# Patient Record
Sex: Female | Born: 1935 | Race: White | Hispanic: No | Marital: Married | Smoking: Never smoker
Health system: Southern US, Community
[De-identification: ages and names within clinical notes are randomized; demographics above are authoritative.]

## PROBLEM LIST (undated history)

## (undated) DIAGNOSIS — K589 Irritable bowel syndrome without diarrhea: Secondary | ICD-10-CM

## (undated) DIAGNOSIS — Z9889 Other specified postprocedural states: Secondary | ICD-10-CM

## (undated) DIAGNOSIS — I4891 Unspecified atrial fibrillation: Secondary | ICD-10-CM

## (undated) DIAGNOSIS — I10 Essential (primary) hypertension: Secondary | ICD-10-CM

## (undated) DIAGNOSIS — R112 Nausea with vomiting, unspecified: Secondary | ICD-10-CM

## (undated) HISTORY — PX: HEMORRHOID SURGERY: SHX153

## (undated) HISTORY — PX: TONSILLECTOMY: SUR1361

## (undated) HISTORY — DX: Irritable bowel syndrome, unspecified: K58.9

---

## 1997-08-26 ENCOUNTER — Other Ambulatory Visit: Admission: RE | Admit: 1997-08-26 | Discharge: 1997-08-26 | Payer: Self-pay | Admitting: Obstetrics & Gynecology

## 1998-02-17 ENCOUNTER — Other Ambulatory Visit: Admission: RE | Admit: 1998-02-17 | Discharge: 1998-02-17 | Payer: Self-pay | Admitting: Obstetrics & Gynecology

## 1998-08-04 ENCOUNTER — Other Ambulatory Visit: Admission: RE | Admit: 1998-08-04 | Discharge: 1998-08-04 | Payer: Self-pay | Admitting: Obstetrics & Gynecology

## 1999-02-02 ENCOUNTER — Other Ambulatory Visit: Admission: RE | Admit: 1999-02-02 | Discharge: 1999-02-02 | Payer: Self-pay | Admitting: Obstetrics & Gynecology

## 1999-08-10 ENCOUNTER — Other Ambulatory Visit: Admission: RE | Admit: 1999-08-10 | Discharge: 1999-08-10 | Payer: Self-pay | Admitting: Obstetrics & Gynecology

## 2000-02-07 ENCOUNTER — Other Ambulatory Visit: Admission: RE | Admit: 2000-02-07 | Discharge: 2000-02-07 | Payer: Self-pay | Admitting: Obstetrics & Gynecology

## 2000-08-08 ENCOUNTER — Other Ambulatory Visit: Admission: RE | Admit: 2000-08-08 | Discharge: 2000-08-08 | Payer: Self-pay | Admitting: Obstetrics & Gynecology

## 2001-01-23 ENCOUNTER — Other Ambulatory Visit: Admission: RE | Admit: 2001-01-23 | Discharge: 2001-01-23 | Payer: Self-pay | Admitting: Obstetrics & Gynecology

## 2002-01-31 ENCOUNTER — Other Ambulatory Visit: Admission: RE | Admit: 2002-01-31 | Discharge: 2002-01-31 | Payer: Self-pay | Admitting: Obstetrics & Gynecology

## 2004-02-03 ENCOUNTER — Other Ambulatory Visit: Admission: RE | Admit: 2004-02-03 | Discharge: 2004-02-03 | Payer: Self-pay | Admitting: Obstetrics & Gynecology

## 2005-01-11 ENCOUNTER — Other Ambulatory Visit: Admission: RE | Admit: 2005-01-11 | Discharge: 2005-01-11 | Payer: Self-pay | Admitting: Radiology

## 2005-08-02 ENCOUNTER — Ambulatory Visit (HOSPITAL_COMMUNITY): Admission: RE | Admit: 2005-08-02 | Discharge: 2005-08-02 | Payer: Self-pay | Admitting: Internal Medicine

## 2005-08-02 ENCOUNTER — Ambulatory Visit: Payer: Self-pay | Admitting: Internal Medicine

## 2010-09-21 ENCOUNTER — Encounter (HOSPITAL_BASED_OUTPATIENT_CLINIC_OR_DEPARTMENT_OTHER): Payer: Medicare Other | Admitting: Internal Medicine

## 2010-09-21 ENCOUNTER — Ambulatory Visit (HOSPITAL_COMMUNITY)
Admission: RE | Admit: 2010-09-21 | Discharge: 2010-09-21 | Disposition: A | Discharge status: Home / Self Care | Payer: Medicare Other | Source: Ambulatory Visit | Attending: Internal Medicine | Admitting: Internal Medicine

## 2010-09-21 DIAGNOSIS — K5730 Diverticulosis of large intestine without perforation or abscess without bleeding: Secondary | ICD-10-CM | POA: Insufficient documentation

## 2010-09-21 DIAGNOSIS — Z860100 Personal history of colon polyps, unspecified: Secondary | ICD-10-CM | POA: Insufficient documentation

## 2010-09-21 DIAGNOSIS — Z800 Family history of malignant neoplasm of digestive organs: Secondary | ICD-10-CM

## 2010-09-21 DIAGNOSIS — K644 Residual hemorrhoidal skin tags: Secondary | ICD-10-CM | POA: Insufficient documentation

## 2010-10-10 NOTE — Op Note (Signed)
  [name], [name]              ACCOUNT NO.:  [id number]  MEDICAL RECORD NO.:  [id number]           PATIENT TYPE:  O  LOCATION:  DAYP                          FACILITY:  [facility]  PHYSICIAN:  Lionel December, M.D.    DATE OF BIRTH:  1933-04-16  DATE OF PROCEDURE:  09/21/2010 DATE OF DISCHARGE:                              OPERATIVE REPORT   PROCEDURE:  Colonoscopy.  INDICATION:  The patient is a 77 year old Caucasian female who had cecal adenoma removed about 5 years ago.  Family history is also positive for colon carcinoma in 2 siblings, brother and a sister, both had their condition diagnosis in their 41s.  Procedure risks were reviewed with the patient.  Informed consent was obtained.  MEDICATIONS FOR CONSCIOUS SEDATION:  Demerol 50 mg IV, Versed 3 mg IV.  FINDINGS:  Procedure performed in endoscopy suite.  The patient's vital signs and O2 saturations were monitored during the procedure and remained stable.  The patient was placed in left lateral recumbent position.  Rectal examination was performed.  No abnormality was noted on external or digital exam.  Pentax videoscope was placed in rectum and advanced under vision into sigmoid colon and beyond.  Preparation was excellent.  Very redundant tortuous sigmoid colon with few scattered diverticula.  Some difficulty encountered in passing the scope from descending to splenic flexure.  Once we were able to do this, I was able to advance the scope into cecum.  Few more diverticula were noted at transverse colon, all of these was small.  Scope was passed into cecum which was identified by appendiceal orifice and ileocecal valve. Pictures were taken for the record.  As the scope was withdrawn, colonic mucosa was carefully examined.  No polyps, tumor masses, or other abnormalities were noted.  Rectal mucosa was normal.  Scope was retroflexed to examine anorectal junction and small hemorrhoids noted below the dentate line along with  single anal papilla.  Endoscope was withdrawn.  Withdrawal time was 6 minutes.  The patient tolerated the procedure well.  FINAL DIAGNOSES: 1. Examination performed to cecum. 2. Redundant colon with few small diverticula at sigmoid and     transverse colon. 3. No evidence of recurrent polyps. 4. External hemorrhoids and single anal papilla.  RECOMMENDATIONS: 1. She will continue high-fiber diet and Align 1 capsule daily. 2. She should consider next exam in 5 years from now.          ______________________________ Lionel December, M.D.     NR/MEDQ  D:  09/21/2010  T:  09/22/2010  Job:  [id number]  cc:   Dr. Reuel Boom  Electronically Signed by Lionel December M.D. on 10/10/2010 04:54:09 PM

## 2011-03-07 ENCOUNTER — Other Ambulatory Visit (INDEPENDENT_AMBULATORY_CARE_PROVIDER_SITE_OTHER): Payer: Self-pay | Admitting: Internal Medicine

## 2012-05-17 ENCOUNTER — Other Ambulatory Visit (INDEPENDENT_AMBULATORY_CARE_PROVIDER_SITE_OTHER): Payer: Self-pay | Admitting: Internal Medicine

## 2012-10-23 ENCOUNTER — Other Ambulatory Visit (INDEPENDENT_AMBULATORY_CARE_PROVIDER_SITE_OTHER): Payer: Self-pay | Admitting: Internal Medicine

## 2013-04-18 ENCOUNTER — Other Ambulatory Visit (INDEPENDENT_AMBULATORY_CARE_PROVIDER_SITE_OTHER): Payer: Self-pay | Admitting: Internal Medicine

## 2014-04-13 ENCOUNTER — Other Ambulatory Visit: Payer: Self-pay | Admitting: Obstetrics & Gynecology

## 2014-04-14 LAB — CYTOLOGY - PAP

## 2014-07-09 ENCOUNTER — Other Ambulatory Visit (INDEPENDENT_AMBULATORY_CARE_PROVIDER_SITE_OTHER): Payer: Self-pay | Admitting: *Deleted

## 2014-07-09 ENCOUNTER — Encounter (INDEPENDENT_AMBULATORY_CARE_PROVIDER_SITE_OTHER): Payer: Self-pay | Admitting: Internal Medicine

## 2014-07-09 ENCOUNTER — Telehealth (INDEPENDENT_AMBULATORY_CARE_PROVIDER_SITE_OTHER): Payer: Self-pay | Admitting: *Deleted

## 2014-07-09 ENCOUNTER — Ambulatory Visit (INDEPENDENT_AMBULATORY_CARE_PROVIDER_SITE_OTHER): Payer: Medicare Other | Admitting: Internal Medicine

## 2014-07-09 VITALS — BP 140/46 | HR 64 | Temp 97.7°F | Ht 65.0 in | Wt 126.0 lb

## 2014-07-09 DIAGNOSIS — Z86010 Personal history of colonic polyps: Secondary | ICD-10-CM

## 2014-07-09 DIAGNOSIS — K589 Irritable bowel syndrome without diarrhea: Secondary | ICD-10-CM

## 2014-07-09 DIAGNOSIS — Z800 Family history of malignant neoplasm of digestive organs: Secondary | ICD-10-CM

## 2014-07-09 DIAGNOSIS — K5909 Other constipation: Secondary | ICD-10-CM

## 2014-07-09 DIAGNOSIS — Z1211 Encounter for screening for malignant neoplasm of colon: Secondary | ICD-10-CM

## 2014-07-09 NOTE — Patient Instructions (Signed)
Colonoscopy.  The risks and benefits such as perforation, bleeding, and infection were reviewed with the patient and is agreeable. 

## 2014-07-09 NOTE — Telephone Encounter (Signed)
Patient needs trilyte 

## 2014-07-09 NOTE — Progress Notes (Signed)
   Subjective:    Patient ID: [name], female    DOB: 05/11/1933, 81 y.o.   MRN: [id number]  HPI Here today with c/o a UTI 06/20/2014. She was seen in the ED for ? Food poisoning. Found to have a UTI and started on Levaquin x 7 days.   She says the medication constipated her and then she had rectal bleeding. Her stool was very hard. She had to strain. She says she saw the blood on the paper. Constipated off and on for a couple of days.  The rectal bleeding x 2-3 days and then resolved. She saw Dr Arcola Jansky this Tuesday and was given Anusol. . She says she is not bleeding now. BMs are back to normal. Family hx of colon cancer. There has been no weight loss. Appetite is good.  No abdominal pain.  No further bleeding.  No NSAIDs   09/22/2010 Colonoscopy:  INDICATION: The patient is a 81 year old Caucasian female who had cecal adenoma removed about 5 years ago. Family history is also positive for colon carcinoma in 2 siblings, brother and a sister, both had their condition diagnosis in their 44s.    FINAL DIAGNOSES: 1. Examination performed to cecum. 2. Redundant colon with few small diverticula at sigmoid and  transverse colon. 3. No evidence of recurrent polyps. 4. External hemorrhoids and single anal papilla. Review of Systems Past Medical History  Diagnosis Date  . IBS (irritable bowel syndrome)     Past Surgical History  Procedure Laterality Date  . Hemorrhoid surgery    . Tonsillectomy      Allergies  Allergen Reactions  . Mercury     Current Outpatient Prescriptions on File Prior to Visit  Medication Sig Dispense Refill  . hyoscyamine (LEVSIN SL) 0.125 MG SL tablet PLACE 1 TABLET UNDER TONGUE THREE TIMES DAILY AS NEEDED 90 tablet 2   No current facility-administered medications on file prior to visit.   Past Medical History  Diagnosis Date  . IBS (irritable bowel syndrome)     Past Surgical History  Procedure Laterality Date  . Hemorrhoid surgery     . Tonsillectomy      Allergies  Allergen Reactions  . Mercury     Current Outpatient Prescriptions on File Prior to Visit  Medication Sig Dispense Refill  . hyoscyamine (LEVSIN SL) 0.125 MG SL tablet PLACE 1 TABLET UNDER TONGUE THREE TIMES DAILY AS NEEDED 90 tablet 2   No current facility-administered medications on file prior to visit.         Objective:   Physical Exam Blood pressure 140/46, pulse 64, temperature 97.7 F (36.5 C), height 5\' 5"  (1.651 m), weight 126 lb (57.153 kg).  Alert and oriented. Skin warm and dry. Oral mucosa is moist.   . Sclera anicteric, conjunctivae is pink. Thyroid not enlarged. No cervical lymphadenopathy. Lungs clear. Heart regular rate and rhythm.  Abdomen is soft. Bowel sounds are positive. No hepatomegaly. No abdominal masses felt. No tenderness.  No edema to lower extremities.  Stool brown and guaiac negative.  Card Lot 2-14 exp. 7/18 Developer [id number]  Exp 09/2013      Assessment & Plan:  Rectal bleeding resolved with Procto cream. She had been constipated.  Family hx of colon cancer. Will schedule a colonoscopy with Dr.Rehman given her hx.

## 2014-07-13 MED ORDER — PEG 3350-KCL-NA BICARB-NACL 420 G PO SOLR: 4000.0000 mL | Freq: Once | ORAL | Status: DC

## 2014-07-24 ENCOUNTER — Encounter (HOSPITAL_COMMUNITY)
Admission: RE | Disposition: A | Discharge status: Home / Self Care | Payer: Self-pay | Source: Ambulatory Visit | Attending: Internal Medicine

## 2014-07-24 ENCOUNTER — Ambulatory Visit (HOSPITAL_COMMUNITY)
Admission: RE | Admit: 2014-07-24 | Discharge: 2014-07-24 | Disposition: A | Discharge status: Home / Self Care | Payer: Medicare Other | Source: Ambulatory Visit | Attending: Internal Medicine | Admitting: Internal Medicine

## 2014-07-24 ENCOUNTER — Encounter (HOSPITAL_COMMUNITY): Payer: Self-pay | Admitting: *Deleted

## 2014-07-24 DIAGNOSIS — K589 Irritable bowel syndrome without diarrhea: Secondary | ICD-10-CM | POA: Insufficient documentation

## 2014-07-24 DIAGNOSIS — Z86010 Personal history of colonic polyps: Secondary | ICD-10-CM | POA: Diagnosis not present

## 2014-07-24 DIAGNOSIS — K625 Hemorrhage of anus and rectum: Secondary | ICD-10-CM | POA: Diagnosis not present

## 2014-07-24 DIAGNOSIS — K644 Residual hemorrhoidal skin tags: Secondary | ICD-10-CM | POA: Diagnosis not present

## 2014-07-24 DIAGNOSIS — K648 Other hemorrhoids: Secondary | ICD-10-CM

## 2014-07-24 DIAGNOSIS — K5909 Other constipation: Secondary | ICD-10-CM

## 2014-07-24 DIAGNOSIS — K5730 Diverticulosis of large intestine without perforation or abscess without bleeding: Secondary | ICD-10-CM | POA: Insufficient documentation

## 2014-07-24 DIAGNOSIS — K624 Stenosis of anus and rectum: Secondary | ICD-10-CM | POA: Diagnosis not present

## 2014-07-24 DIAGNOSIS — Z800 Family history of malignant neoplasm of digestive organs: Secondary | ICD-10-CM | POA: Diagnosis not present

## 2014-07-24 DIAGNOSIS — F419 Anxiety disorder, unspecified: Secondary | ICD-10-CM | POA: Diagnosis not present

## 2014-07-24 DIAGNOSIS — K6289 Other specified diseases of anus and rectum: Secondary | ICD-10-CM

## 2014-07-24 DIAGNOSIS — Z888 Allergy status to other drugs, medicaments and biological substances status: Secondary | ICD-10-CM | POA: Insufficient documentation

## 2014-07-24 HISTORY — PX: COLONOSCOPY: SHX5424

## 2014-07-24 SURGERY — COLONOSCOPY
Anesthesia: Moderate Sedation

## 2014-07-24 MED ORDER — MEPERIDINE HCL 50 MG/ML IJ SOLN
INTRAMUSCULAR | Status: AC
  Filled 2014-07-24: qty 1

## 2014-07-24 MED ORDER — MIDAZOLAM HCL 5 MG/5ML IJ SOLN
INTRAMUSCULAR | Status: AC
  Filled 2014-07-24: qty 10

## 2014-07-24 MED ORDER — SODIUM CHLORIDE 0.9 % IV SOLN
INTRAVENOUS | Status: DC
  Administered 2014-07-24: 09:00:00 via INTRAVENOUS

## 2014-07-24 MED ORDER — MIDAZOLAM HCL 5 MG/5ML IJ SOLN
INTRAMUSCULAR | Status: DC | PRN
  Administered 2014-07-24 (×2): 1 mg via INTRAVENOUS
  Administered 2014-07-24: 2 mg via INTRAVENOUS

## 2014-07-24 MED ORDER — BENEFIBER DRINK MIX PO PACK: 4.0000 g | PACK | Freq: Every day | ORAL | Status: DC

## 2014-07-24 MED ORDER — STERILE WATER FOR IRRIGATION IR SOLN
Status: DC | PRN
  Administered 2014-07-24: 09:00:00

## 2014-07-24 MED ORDER — MEPERIDINE HCL 50 MG/ML IJ SOLN
INTRAMUSCULAR | Status: DC | PRN
  Administered 2014-07-24: 25 mg via INTRAVENOUS

## 2014-07-24 NOTE — Op Note (Signed)
COLONOSCOPY PROCEDURE REPORT  PATIENT:  [name]  MR#:  [id number] Birthdate:  09-18-1932, 81 y.o., female Endoscopist:  Dr. Rogene Houston, MD Referred By:  Dr. Gar Ponto, MD Procedure Date: 07/24/2014  Procedure:   Colonoscopy  Indications:  Patient is 81 year old Caucasian female who has history of colonic polyps family history of colon carcinoma whose last colonoscopy was in May 2012 who presents with history of rectal bleeding lasting for a few days and she also gives history of constipation. She is undergoing diagnostic colonoscopy. While no polyps were found on her last colonoscopy she had cecal adenoma removed about 9 years ago.  Informed Consent:  The procedure and risks were reviewed with the patient and informed consent was obtained.  Medications:  Demerol 25 mg IV Versed 4 mg IV  Description of procedure:  After a digital rectal exam was performed, that colonoscope was advanced from the anus through the rectum and colon to the area of the cecum, ileocecal valve and appendiceal orifice. The cecum was deeply intubated. These structures were well-seen and photographed for the record. From the level of the cecum and ileocecal valve, the scope was slowly and cautiously withdrawn. The mucosal surfaces were carefully surveyed utilizing scope tip to flexion to facilitate fold flattening as needed. The scope was pulled down into the rectum where a thorough exam including retroflexion was performed.  Findings:   Prep excellent. Redundant colon with few small diverticula scattered throughout. Normal rectal mucosa. Small hemorrhoids below the dentate line along with anal papillae. Noncritical anal stenosis.    Therapeutic/Diagnostic Maneuvers Performed:  None  Complications:  None  Cecal Withdrawal Time:  7  minutes  Impression:  Examination performed to cecum. Few scattered diverticula throughout the colon. Noncritical anal stenosis along with external hemorrhoids and  anal papillae.  Recommendations:  Standard instructions given. High fiber diet. Patient will call if she develops constipation again.  REHMAN,NAJEEB U  07/24/2014 10:01 AM  CC: Dr. Gar Ponto, MD & Dr. Rayne Du ref. provider found

## 2014-07-24 NOTE — H&P (Signed)
[name] is an 81 y.o. female.   Chief Complaint: Patient is here for colonoscopy. HPI: Patient is 81 year old Caucasian female who presents with recent history of rectal bleeding. She believes she developed fissure on becoming constipated which was from the medication she took for UTI. Bleeding has stopped. She has history of colonic adenomas. Last colonoscopy was in May 2012. Family history significant for venous malignancies including colon carcinoma in a sister who was in her 68s and she developed 2 other malignancies and died in her 29s. Her brother had surgery for CRC in 5s and is doing fine at age 61.  Past Medical History  Diagnosis Date  . IBS (irritable bowel syndrome)     Past Surgical History  Procedure Laterality Date  . Hemorrhoid surgery    . Tonsillectomy      History reviewed. No pertinent family history. Social History:  reports that she has never smoked. She does not have any smokeless tobacco history on file. She reports that she does not drink alcohol or use illicit drugs.  Allergies:  Allergies  Allergen Reactions  . Mercury Other (See Comments)    unknown    Medications Prior to Admission  Medication Sig Dispense Refill  . ALPRAZolam (XANAX) 0.25 MG tablet Take 0.25 mg by mouth at bedtime as needed for anxiety.    . calcium carbonate (OS-CAL) 600 MG TABS tablet Take 600 mg by mouth 2 (two) times daily with a meal.    . Calcium Carbonate-Vitamin D (CALTRATE COLON HEALTH PO) Take 1 tablet by mouth daily.     . cholecalciferol (VITAMIN D) 400 UNITS TABS tablet Take 1,000 Units by mouth.    . citalopram (CELEXA) 20 MG tablet Take 20 mg by mouth daily.    . hydrocortisone (ANUSOL-HC) 2.5 % rectal cream Place 1 application rectally 2 (two) times daily.    . hyoscyamine (LEVSIN SL) 0.125 MG SL tablet PLACE 1 TABLET UNDER TONGUE THREE TIMES DAILY AS NEEDED (Patient taking differently: PLACE 1 TABLET UNDER TONGUE THREE TIMES DAILY AS NEEDED FOR IBS) 90 tablet 2   . polyethylene glycol-electrolytes (NULYTELY/GOLYTELY) 420 G solution Take 4,000 mLs by mouth once. 4000 mL 0    No results found for this or any previous visit (from the past 48 hour(s)). No results found.  ROS  Blood pressure 155/83, pulse 90, temperature 98 F (36.7 C), temperature source Oral, resp. rate 18, height 5\' 5"  (1.651 m), weight 121 lb (54.885 kg), SpO2 97 %. Physical Exam  Constitutional: She appears well-developed and well-nourished.  HENT:  Mouth/Throat: Oropharynx is clear and moist.  Eyes: Conjunctivae are normal. No scleral icterus.  Neck: No thyromegaly present.  Cardiovascular: Normal rate, regular rhythm and normal heart sounds.   No murmur heard. Respiratory: Effort normal and breath sounds normal.  GI: Soft. She exhibits no distension and no mass. There is no tenderness. There is no rebound.  Musculoskeletal: She exhibits no edema.  Lymphadenopathy:    She has no cervical adenopathy.  Neurological: She is alert.  Skin: Skin is warm and dry.     Assessment/Plan History of rectal bleeding. History of colonic polyps. Family history of colon carcinoma in 2 siblings. Diagnostic colonoscopy.  REHMAN,NAJEEB U 07/24/2014, 9:20 AM

## 2014-07-24 NOTE — Discharge Instructions (Signed)
Resume usual medications and high fiber diet. Benefiber 4 g by mouth daily at bedtime. No driving for 24 hours.   High-Fiber Diet Fiber is found in fruits, vegetables, and grains. A high-fiber diet encourages the addition of more whole grains, legumes, fruits, and vegetables in your diet. The recommended amount of fiber for adult males is 38 g per day. For adult females, it is 25 g per day. Pregnant and lactating women should get 28 g of fiber per day. If you have a digestive or bowel problem, ask your caregiver for advice before adding high-fiber foods to your diet. Eat a variety of high-fiber foods instead of only a select few type of foods.  PURPOSE  To increase stool bulk.  To make bowel movements more regular to prevent constipation.  To lower cholesterol.  To prevent overeating. WHEN IS THIS DIET USED?  It may be used if you have constipation and hemorrhoids.  It may be used if you have uncomplicated diverticulosis (intestine condition) and irritable bowel syndrome.  It may be used if you need help with weight management.  It may be used if you want to add it to your diet as a protective measure against atherosclerosis, diabetes, and cancer. SOURCES OF FIBER  Whole-grain breads and cereals.  Fruits, such as apples, oranges, bananas, berries, prunes, and pears.  Vegetables, such as green peas, carrots, sweet potatoes, beets, broccoli, cabbage, spinach, and artichokes.  Legumes, such split peas, soy, lentils.  Almonds. FIBER CONTENT IN FOODS Starches and Grains / Dietary Fiber (g)  Cheerios, 1 cup / 3 g  Corn Flakes cereal, 1 cup / 0.7 g  Rice crispy treat cereal, 1 cup / 0.3 g  Instant oatmeal (cooked),  cup / 2 g  Frosted wheat cereal, 1 cup / 5.1 g  Brown, long-grain rice (cooked), 1 cup / 3.5 g  White, long-grain rice (cooked), 1 cup / 0.6 g  Enriched macaroni (cooked), 1 cup / 2.5 g Legumes / Dietary Fiber (g)  Baked beans (canned, plain, or  vegetarian),  cup / 5.2 g  Kidney beans (canned),  cup / 6.8 g  Pinto beans (cooked),  cup / 5.5 g Breads and Crackers / Dietary Fiber (g)  Plain or honey graham crackers, 2 squares / 0.7 g  Saltine crackers, 3 squares / 0.3 g  Plain, salted pretzels, 10 pieces / 1.8 g  Whole-wheat bread, 1 slice / 1.9 g  White bread, 1 slice / 0.7 g  Raisin bread, 1 slice / 1.2 g  Plain bagel, 3 oz / 2 g  Flour tortilla, 1 oz / 0.9 g  Corn tortilla, 1 small / 1.5 g  Hamburger or hotdog bun, 1 small / 0.9 g Fruits / Dietary Fiber (g)  Apple with skin, 1 medium / 4.4 g  Sweetened applesauce,  cup / 1.5 g  Banana,  medium / 1.5 g  Grapes, 10 grapes / 0.4 g  Orange, 1 small / 2.3 g  Raisin, 1.5 oz / 1.6 g  Melon, 1 cup / 1.4 g Vegetables / Dietary Fiber (g)  Green beans (canned),  cup / 1.3 g  Carrots (cooked),  cup / 2.3 g  Broccoli (cooked),  cup / 2.8 g  Peas (cooked),  cup / 4.4 g  Mashed potatoes,  cup / 1.6 g  Lettuce, 1 cup / 0.5 g  Corn (canned),  cup / 1.6 g  Tomato,  cup / 1.1 g Document Released: 05/01/2005 Document Revised: 10/31/2011 Document Reviewed: 08/03/2011  [employer] Patient Information 2015 [place]. This information is not intended to replace advice given to you by your health care provider. Make sure you discuss any questions you have with your health care provider. Colonoscopy, Care After These instructions give you information on caring for yourself after your procedure. Your doctor may also give you more specific instructions. Call your doctor if you have any problems or questions after your procedure. HOME CARE  Do not drive for 24 hours.  Do not sign important papers or use machinery for 24 hours.  You may shower.  You may go back to your usual activities, but go slower for the first 24 hours.  Take rest breaks often during the first 24 hours.  Walk around or use warm packs on your belly (abdomen) if you have belly  cramping or gas.  Drink enough fluids to keep your pee (urine) clear or pale yellow.  Resume your normal diet. Avoid heavy or fried foods.  Avoid drinking alcohol for 24 hours or as told by your doctor.  Only take medicines as told by your doctor. If a tissue sample (biopsy) was taken during the procedure:   Do not take aspirin or blood thinners for 7 days, or as told by your doctor.  Do not drink alcohol for 7 days, or as told by your doctor.  Eat soft foods for the first 24 hours. GET HELP IF: You still have a small amount of blood in your poop (stool) 2-3 days after the procedure. GET HELP RIGHT AWAY IF:  You have more than a small amount of blood in your poop.  You see clumps of tissue (blood clots) in your poop.  Your belly is puffy (swollen).  You feel sick to your stomach (nauseous) or throw up (vomit).  You have a fever.  You have belly pain that gets worse and medicine does not help. MAKE SURE YOU:  Understand these instructions.  Will watch your condition.  Will get help right away if you are not doing well or get worse. Document Released: 06/03/2010 Document Revised: 05/06/2013 Document Reviewed: 01/06/2013 [facility] Patient Information 2015 [place], [place]. This information is not intended to replace advice given to you by your health care provider. Make sure you discuss any questions you have with your health care provider.

## 2014-07-27 ENCOUNTER — Encounter (HOSPITAL_COMMUNITY): Payer: Self-pay | Admitting: Internal Medicine

## 2017-01-08 NOTE — Patient Instructions (Signed)
Your procedure is scheduled on: 02/02/2017   Report to [facility] at  103   AM.  Call this number if you have problems the morning of surgery: [phone]   Do not eat food or drink liquids :After Midnight.      Take these medicines the morning of surgery with A SIP OF WATER: celexa   Do not wear jewelry, make-up or nail polish.  Do not wear lotions, powders, or perfumes. You may wear deodorant.  Do not shave 48 hours prior to surgery.  Do not bring valuables to the hospital.  Contacts, dentures or bridgework may not be worn into surgery.  Leave suitcase in the car. After surgery it may be brought to your room.  For patients admitted to the hospital, checkout time is 11:00 AM the day of discharge.   Patients discharged the day of surgery will not be allowed to drive home.  :     Please read over the following fact sheets that you were given: Coughing and Deep Breathing, Surgical Site Infection Prevention, Anesthesia Post-op Instructions and Care and Recovery After Surgery    Cataract A cataract is a clouding of the lens of the eye. When a lens becomes cloudy, vision is reduced based on the degree and nature of the clouding. Many cataracts reduce vision to some degree. Some cataracts make people more near-sighted as they develop. Other cataracts increase glare. Cataracts that are ignored and become worse can sometimes look white. The white color can be seen through the pupil. CAUSES   Aging. However, cataracts may occur at any age, even in newborns.   Certain drugs.   Trauma to the eye.   Certain diseases such as diabetes.   Specific eye diseases such as chronic inflammation inside the eye or a sudden attack of a rare form of glaucoma.   Inherited or acquired medical problems.  SYMPTOMS   Gradual, progressive drop in vision in the affected eye.   Severe, rapid visual loss. This most often happens when trauma is the cause.  DIAGNOSIS  To detect a cataract, an eye doctor  examines the lens. Cataracts are best diagnosed with an exam of the eyes with the pupils enlarged (dilated) by drops.  TREATMENT  For an early cataract, vision may improve by using different eyeglasses or stronger lighting. If that does not help your vision, surgery is the only effective treatment. A cataract needs to be surgically removed when vision loss interferes with your everyday activities, such as driving, reading, or watching TV. A cataract may also have to be removed if it prevents examination or treatment of another eye problem. Surgery removes the cloudy lens and usually replaces it with a substitute lens (intraocular lens, IOL).  At a time when both you and your doctor agree, the cataract will be surgically removed. If you have cataracts in both eyes, only one is usually removed at a time. This allows the operated eye to heal and be out of danger from any possible problems after surgery (such as infection or poor wound healing). In rare cases, a cataract may be doing damage to your eye. In these cases, your caregiver may advise surgical removal right away. The vast majority of people who have cataract surgery have better vision afterward. HOME CARE INSTRUCTIONS  If you are not planning surgery, you may be asked to do the following:  Use different eyeglasses.   Use stronger or brighter lighting.   Ask your eye doctor about reducing your medicine  dose or changing medicines if it is thought that a medicine caused your cataract. Changing medicines does not make the cataract go away on its own.   Become familiar with your surroundings. Poor vision can lead to injury. Avoid bumping into things on the affected side. You are at a higher risk for tripping or falling.   Exercise extreme care when driving or operating machinery.   Wear sunglasses if you are sensitive to bright light or experiencing problems with glare.  SEEK IMMEDIATE MEDICAL CARE IF:   You have a worsening or sudden vision  loss.   You notice redness, swelling, or increasing pain in the eye.   You have a fever.  Document Released: 05/01/2005 Document Revised: 04/20/2011 Document Reviewed: 12/23/2010 [facility] Patient Information 2012 [place].PATIENT INSTRUCTIONS POST-ANESTHESIA  IMMEDIATELY FOLLOWING SURGERY:  Do not drive or operate machinery for the first twenty four hours after surgery.  Do not make any important decisions for twenty four hours after surgery or while taking narcotic pain medications or sedatives.  If you develop intractable nausea and vomiting or a severe headache please notify your doctor immediately.  FOLLOW-UP:  Please make an appointment with your surgeon as instructed. You do not need to follow up with anesthesia unless specifically instructed to do so.  WOUND CARE INSTRUCTIONS (if applicable):  Keep a dry clean dressing on the anesthesia/puncture wound site if there is drainage.  Once the wound has quit draining you may leave it open to air.  Generally you should leave the bandage intact for twenty four hours unless there is drainage.  If the epidural site drains for more than 36-48 hours please call the anesthesia department.  QUESTIONS?:  Please feel free to call your physician or the hospital operator if you have any questions, and they will be happy to assist you.

## 2017-01-12 ENCOUNTER — Other Ambulatory Visit: Payer: Self-pay

## 2017-01-12 ENCOUNTER — Encounter (HOSPITAL_COMMUNITY)
Admission: RE | Admit: 2017-01-12 | Discharge: 2017-01-12 | Disposition: A | Discharge status: Home / Self Care | Payer: Medicare Other | Source: Ambulatory Visit | Attending: Ophthalmology | Admitting: Ophthalmology

## 2017-01-12 ENCOUNTER — Encounter (HOSPITAL_COMMUNITY): Payer: Self-pay

## 2017-01-12 DIAGNOSIS — Z01810 Encounter for preprocedural cardiovascular examination: Secondary | ICD-10-CM | POA: Insufficient documentation

## 2017-01-12 DIAGNOSIS — Z01812 Encounter for preprocedural laboratory examination: Secondary | ICD-10-CM | POA: Diagnosis present

## 2017-01-12 HISTORY — DX: Other specified postprocedural states: Z98.890

## 2017-01-12 HISTORY — DX: Nausea with vomiting, unspecified: R11.2

## 2017-01-12 LAB — BASIC METABOLIC PANEL
Anion gap: 5 (ref 5–15)
BUN: 14 mg/dL (ref 6–20)
CO2: 28 mmol/L (ref 22–32)
CREATININE: 0.81 mg/dL (ref 0.44–1.00)
Calcium: 9.1 mg/dL (ref 8.9–10.3)
Chloride: 104 mmol/L (ref 101–111)
Glucose, Bld: 98 mg/dL (ref 65–99)
POTASSIUM: 4 mmol/L (ref 3.5–5.1)
SODIUM: 137 mmol/L (ref 135–145)

## 2017-01-12 LAB — CBC
HCT: 38 % (ref 36.0–46.0)
HEMOGLOBIN: 12.2 g/dL (ref 12.0–15.0)
MCH: 30 pg (ref 26.0–34.0)
MCHC: 32.1 g/dL (ref 30.0–36.0)
MCV: 93.6 fL (ref 78.0–100.0)
PLATELETS: 85 10*3/uL — AB (ref 150–400)
RBC: 4.06 MIL/uL (ref 3.87–5.11)
RDW: 13.7 % (ref 11.5–15.5)
WBC: 4.8 10*3/uL (ref 4.0–10.5)

## 2017-02-02 ENCOUNTER — Ambulatory Visit (HOSPITAL_COMMUNITY)
Admission: RE | Admit: 2017-02-02 | Discharge: 2017-02-02 | Disposition: A | Discharge status: Home / Self Care | Payer: Medicare Other | Source: Ambulatory Visit | Attending: Ophthalmology | Admitting: Ophthalmology

## 2017-02-02 ENCOUNTER — Ambulatory Visit (HOSPITAL_COMMUNITY): Payer: Medicare Other | Admitting: Anesthesiology

## 2017-02-02 ENCOUNTER — Encounter (HOSPITAL_COMMUNITY)
Admission: RE | Disposition: A | Discharge status: Home / Self Care | Payer: Self-pay | Source: Ambulatory Visit | Attending: Ophthalmology

## 2017-02-02 ENCOUNTER — Encounter (HOSPITAL_COMMUNITY): Payer: Self-pay | Admitting: Anesthesiology

## 2017-02-02 DIAGNOSIS — F410 Panic disorder [episodic paroxysmal anxiety] without agoraphobia: Secondary | ICD-10-CM | POA: Insufficient documentation

## 2017-02-02 DIAGNOSIS — K589 Irritable bowel syndrome without diarrhea: Secondary | ICD-10-CM | POA: Diagnosis not present

## 2017-02-02 DIAGNOSIS — H268 Other specified cataract: Secondary | ICD-10-CM | POA: Insufficient documentation

## 2017-02-02 DIAGNOSIS — Z79899 Other long term (current) drug therapy: Secondary | ICD-10-CM | POA: Insufficient documentation

## 2017-02-02 HISTORY — PX: CATARACT EXTRACTION W/PHACO: SHX586

## 2017-02-02 SURGERY — PHACOEMULSIFICATION, CATARACT, WITH IOL INSERTION
Anesthesia: Monitor Anesthesia Care | Site: Eye | Laterality: Left

## 2017-02-02 MED ORDER — POVIDONE-IODINE 5 % OP SOLN
OPHTHALMIC | Status: DC | PRN
  Administered 2017-02-02: 1 via OPHTHALMIC

## 2017-02-02 MED ORDER — SODIUM HYALURONATE 23 MG/ML IO SOLN
INTRAOCULAR | Status: DC | PRN
  Administered 2017-02-02: 0.6 mL via INTRAOCULAR

## 2017-02-02 MED ORDER — NEOMYCIN-POLYMYXIN-DEXAMETH 3.5-10000-0.1 OP SUSP
OPHTHALMIC | Status: DC | PRN
  Administered 2017-02-02: 2 [drp] via OPHTHALMIC

## 2017-02-02 MED ORDER — LIDOCAINE HCL (PF) 1 % IJ SOLN
INTRAOCULAR | Status: DC | PRN
  Administered 2017-02-02: 1 mL via OPHTHALMIC

## 2017-02-02 MED ORDER — TETRACAINE HCL 0.5 % OP SOLN
1.0000 [drp] | OPHTHALMIC | Status: AC
  Administered 2017-02-02 (×3): 1 [drp] via OPHTHALMIC

## 2017-02-02 MED ORDER — BSS IO SOLN
INTRAOCULAR | Status: DC | PRN
  Administered 2017-02-02: 500 mL

## 2017-02-02 MED ORDER — MIDAZOLAM HCL 2 MG/2ML IJ SOLN
INTRAMUSCULAR | Status: AC
  Filled 2017-02-02: qty 2

## 2017-02-02 MED ORDER — PHENYLEPHRINE HCL 2.5 % OP SOLN
1.0000 [drp] | OPHTHALMIC | Status: AC
  Administered 2017-02-02 (×3): 1 [drp] via OPHTHALMIC

## 2017-02-02 MED ORDER — ONDANSETRON 4 MG PO TBDP
ORAL_TABLET | ORAL | Status: AC
  Filled 2017-02-02: qty 1

## 2017-02-02 MED ORDER — CYCLOPENTOLATE-PHENYLEPHRINE 0.2-1 % OP SOLN
1.0000 [drp] | OPHTHALMIC | Status: AC
  Administered 2017-02-02 (×3): 1 [drp] via OPHTHALMIC

## 2017-02-02 MED ORDER — BSS IO SOLN
INTRAOCULAR | Status: DC | PRN
  Administered 2017-02-02: 15 mL

## 2017-02-02 MED ORDER — PROVISC 10 MG/ML IO SOLN
INTRAOCULAR | Status: DC | PRN
  Administered 2017-02-02: 0.85 mL via INTRAOCULAR

## 2017-02-02 MED ORDER — LACTATED RINGERS IV SOLN
INTRAVENOUS | Status: DC
  Administered 2017-02-02: 08:00:00 via INTRAVENOUS

## 2017-02-02 MED ORDER — MIDAZOLAM HCL 2 MG/2ML IJ SOLN
1.0000 mg | Freq: Once | INTRAMUSCULAR | Status: AC | PRN
  Administered 2017-02-02: 2 mg via INTRAVENOUS

## 2017-02-02 MED ORDER — ONDANSETRON 4 MG PO TBDP
4.0000 mg | ORAL_TABLET | Freq: Once | ORAL | Status: AC
  Administered 2017-02-02: 4 mg via ORAL

## 2017-02-02 MED ORDER — ONDANSETRON HCL 4 MG/2ML IJ SOLN
INTRAMUSCULAR | Status: AC
  Filled 2017-02-02: qty 2

## 2017-02-02 SURGICAL SUPPLY — 15 items

## 2017-02-02 NOTE — Op Note (Signed)
Date of procedure: 02/02/17  Pre-operative diagnosis: Visually significant cataract, Left Eye  Post-operative diagnosis: Visually significant cataract, Left Eye  Procedure: Removal of cataract via phacoemulsification and insertion of intra-ocular lens AMO PCB00  +23.0D into the capsular bag of the Left Eye  Attending surgeon: Gerda Diss. Rhealynn Myhre, MD, MA  Anesthesia: MAC, Topical Akten  Complications: None  Estimated Blood Loss: <37m (minimal)  Specimens: None  Implants: As above  Indications:  Visually significant cataract, Left Eye  Procedure:  The patient was seen and identified in the pre-operative area. The operative eye was identified and dilated.  The operative eye was marked.  Topical anesthesia was administered to the operative eye.     The patient was then to the operative suite and placed in the supine position.  A timeout was performed confirming the patient, procedure to be performed, and all other relevant information.   The patient's face was prepped and draped in the usual fashion for intra-ocular surgery.  A lid speculum was placed into the operative eye and the surgical microscope moved into place and focused.  A superotemporal paracentesis was created using a 20 gauge paracentesis blade.  Shugarcaine was injected into the anterior chamber.  Viscoelastic was injected into the anterior chamber.  A temporal clear-corneal main wound incision was created using a 2.432mmicrokeratome.  A continuous curvilinear capsulorrhexis was initiated using an irrigating cystitome and completed using capsulorrhexis forceps.  Hydrodissection and hydrodeliniation were performed.  Viscoelastic was injected into the anterior chamber.  A phacoemulsification handpiece and a chopper as a second instrument were used to remove the nucleus and epinucleus. The irrigation/aspiration handpiece was used to remove any remaining cortical material.   The capsular bag was reinflated with viscoelastic, checked,  and found to be intact.  The intraocular lens was inserted into the capsular bag and dialed into place using a Kuglen hook.  The irrigation/aspiration handpiece was used to remove any remaining viscoelastic.  The clear corneal wound and paracentesis wounds were then hydrated and checked with Weck-Cels to be watertight.  The lid-speculum and drape was removed, and the patient's face was cleaned with a wet and dry 4x4.  Maxitrol was instilled in the eye before a clear shield was taped over the eye. The patient was taken to the post-operative care unit in good condition, having tolerated the procedure well.  Post-Op Instructions: [facility] and will receive all other orders and instructions.

## 2017-02-02 NOTE — Anesthesia Procedure Notes (Signed)
Procedure Name: MAC Date/Time: 02/02/2017 8:34 AM Performed by: Vista Deck Pre-anesthesia Checklist: Patient identified, Emergency Drugs available, Suction available, Timeout performed and Patient being monitored Patient Re-evaluated:Patient Re-evaluated prior to induction Oxygen Delivery Method: Nasal Cannula

## 2017-02-02 NOTE — Anesthesia Preprocedure Evaluation (Addendum)
Anesthesia Evaluation  Patient identified by MRN, date of birth, ID band Patient awake    History of Anesthesia Complications (+) PONV  Airway Mallampati: I  TM Distance: >3 FB Neck ROM: Full    Dental  (+) Teeth Intact   Pulmonary neg pulmonary ROS,    Pulmonary exam normal        Cardiovascular Exercise Tolerance: Good negative cardio ROS   Rhythm:Regular Rate:Normal  2018 Normal sinus rhythm Septal infarct , age undetermined Abnormal ECG   Neuro/Psych negative neurological ROS  negative psych ROS   GI/Hepatic negative GI ROS, Neg liver ROS,   Endo/Other  negative endocrine ROS  Renal/GU negative Renal ROS     Musculoskeletal negative musculoskeletal ROS (+)   Abdominal Normal abdominal exam  (+)   Peds  Hematology negative hematology ROS (+)   Anesthesia Other Findings   Reproductive/Obstetrics                             Anesthesia Physical Anesthesia Plan  ASA: I  Anesthesia Plan: MAC   Post-op Pain Management:    Induction:   PONV Risk Score and Plan: Ondansetron and Midazolam  Airway Management Planned: Simple Face Mask  Additional Equipment:   Intra-op Plan:   Post-operative Plan:   Informed Consent: I have reviewed the patients History and Physical, chart, labs and discussed the procedure including the risks, benefits and alternatives for the proposed anesthesia with the patient or authorized representative who has indicated his/her understanding and acceptance.   Dental advisory given  Plan Discussed with: CRNA  Anesthesia Plan Comments:        Anesthesia Quick Evaluation

## 2017-02-02 NOTE — Discharge Instructions (Signed)
PATIENT INSTRUCTIONS POST-ANESTHESIA  IMMEDIATELY FOLLOWING SURGERY:  Do not drive or operate machinery for the first twenty four hours after surgery.  Do not make any important decisions for twenty four hours after surgery or while taking narcotic pain medications or sedatives.  If you develop intractable nausea and vomiting or a severe headache please notify your doctor immediately.  FOLLOW-UP:  Please make an appointment with your surgeon as instructed. You do not need to follow up with anesthesia unless specifically instructed to do so.  WOUND CARE INSTRUCTIONS (if applicable):  Keep a dry clean dressing on the anesthesia/puncture wound site if there is drainage.  Once the wound has quit draining you may leave it open to air.  Generally you should leave the bandage intact for twenty four hours unless there is drainage.  If the epidural site drains for more than 36-48 hours please call the anesthesia department.  QUESTIONS?:  Please feel free to call your physician or the hospital operator if you have any questions, and they will be happy to assist you.         Please discharge patient when stable, will follow up today with Dr. Marisa Hua at the [facility] office at 10:20AM.  Leave shield in place until visit.  All paperwork with discharge instructions will be given at the office.

## 2017-02-02 NOTE — Anesthesia Postprocedure Evaluation (Signed)
Anesthesia Post Note  Patient: [name]  Procedure(s) Performed: Procedure(s) (LRB): CATARACT EXTRACTION PHACO AND INTRAOCULAR LENS PLACEMENT ([place]) (Left)  Patient location during evaluation: Short Stay Anesthesia Type: MAC Level of consciousness: awake and alert Pain management: satisfactory to patient Vital Signs Assessment: post-procedure vital signs reviewed and stable Respiratory status: spontaneous breathing Cardiovascular status: stable Postop Assessment: no apparent nausea or vomiting Anesthetic complications: no     Last Vitals:  Vitals:   02/02/17 0825 02/02/17 0830  BP: 136/62 (!) 137/56  Resp: (!) 27 17  Temp:    SpO2: 100% 100%    Last Pain:  Vitals:   02/02/17 0730  TempSrc: Oral                 [name]

## 2017-02-02 NOTE — Transfer of Care (Signed)
Immediate Anesthesia Transfer of Care Note  Patient: [name]  Procedure(s) Performed: Procedure(s) with comments: CATARACT EXTRACTION PHACO AND INTRAOCULAR LENS PLACEMENT ([place]) (Left) - CDE: 14.49  Patient Location: PACU  Anesthesia Type:MAC  Level of Consciousness: awake and alert   Airway & Oxygen Therapy: Patient Spontanous Breathing  Post-op Assessment: Report given to RN and Post -op Vital signs reviewed and stable  Post vital signs: Reviewed and stable  Last Vitals:  Vitals:   02/02/17 0825 02/02/17 0830  BP: 136/62 (!) 137/56  Resp: (!) 27 17  Temp:    SpO2: 100% 100%    Last Pain:  Vitals:   02/02/17 0730  TempSrc: Oral      Patients Stated Pain Goal: 6 (34/35/68 [id number])  Complications: No apparent anesthesia complications

## 2017-02-02 NOTE — H&P (Signed)
The H and P was reviewed and updated. The patient was examined.  No changes were found after exam.  The surgical eye was marked.  

## 2017-02-12 ENCOUNTER — Other Ambulatory Visit (HOSPITAL_COMMUNITY): Payer: Medicare Other

## 2017-02-27 ENCOUNTER — Encounter (HOSPITAL_COMMUNITY)
Admission: RE | Admit: 2017-02-27 | Discharge: 2017-02-27 | Disposition: A | Discharge status: Home / Self Care | Payer: Medicare Other | Source: Ambulatory Visit | Attending: Ophthalmology | Admitting: Ophthalmology

## 2017-02-27 ENCOUNTER — Encounter (HOSPITAL_COMMUNITY): Payer: Self-pay

## 2017-03-02 ENCOUNTER — Ambulatory Visit (HOSPITAL_COMMUNITY): Payer: Medicare Other | Admitting: Anesthesiology

## 2017-03-02 ENCOUNTER — Encounter (HOSPITAL_COMMUNITY): Payer: Self-pay

## 2017-03-02 ENCOUNTER — Encounter (HOSPITAL_COMMUNITY)
Admission: RE | Disposition: A | Discharge status: Home / Self Care | Payer: Self-pay | Source: Ambulatory Visit | Attending: Ophthalmology

## 2017-03-02 ENCOUNTER — Ambulatory Visit (HOSPITAL_COMMUNITY)
Admission: RE | Admit: 2017-03-02 | Discharge: 2017-03-02 | Disposition: A | Discharge status: Home / Self Care | Payer: Medicare Other | Source: Ambulatory Visit | Attending: Ophthalmology | Admitting: Ophthalmology

## 2017-03-02 DIAGNOSIS — H2511 Age-related nuclear cataract, right eye: Secondary | ICD-10-CM | POA: Insufficient documentation

## 2017-03-02 HISTORY — PX: CATARACT EXTRACTION W/PHACO: SHX586

## 2017-03-02 SURGERY — PHACOEMULSIFICATION, CATARACT, WITH IOL INSERTION
Anesthesia: Monitor Anesthesia Care | Site: Eye | Laterality: Right

## 2017-03-02 MED ORDER — MIDAZOLAM HCL 2 MG/2ML IJ SOLN
1.0000 mg | INTRAMUSCULAR | Status: AC
  Administered 2017-03-02: 2 mg via INTRAVENOUS

## 2017-03-02 MED ORDER — NEOMYCIN-POLYMYXIN-DEXAMETH 3.5-10000-0.1 OP SUSP
OPHTHALMIC | Status: DC | PRN
  Administered 2017-03-02: 2 [drp] via OPHTHALMIC

## 2017-03-02 MED ORDER — FENTANYL CITRATE (PF) 100 MCG/2ML IJ SOLN
INTRAMUSCULAR | Status: AC
  Filled 2017-03-02: qty 2

## 2017-03-02 MED ORDER — POVIDONE-IODINE 5 % OP SOLN
OPHTHALMIC | Status: DC | PRN
  Administered 2017-03-02: 1 via OPHTHALMIC

## 2017-03-02 MED ORDER — MIDAZOLAM HCL 2 MG/2ML IJ SOLN
INTRAMUSCULAR | Status: AC
  Filled 2017-03-02: qty 2

## 2017-03-02 MED ORDER — CYCLOPENTOLATE-PHENYLEPHRINE 0.2-1 % OP SOLN
1.0000 [drp] | OPHTHALMIC | Status: AC
  Administered 2017-03-02 (×3): 1 [drp] via OPHTHALMIC

## 2017-03-02 MED ORDER — LACTATED RINGERS IV SOLN
INTRAVENOUS | Status: DC
  Administered 2017-03-02: 10:00:00 via INTRAVENOUS

## 2017-03-02 MED ORDER — SODIUM HYALURONATE 23 MG/ML IO SOLN
INTRAOCULAR | Status: DC | PRN
  Administered 2017-03-02: 0.6 mL via INTRAOCULAR

## 2017-03-02 MED ORDER — EPINEPHRINE PF 1 MG/ML IJ SOLN
INTRAOCULAR | Status: DC | PRN
  Administered 2017-03-02: 500 mL

## 2017-03-02 MED ORDER — PROVISC 10 MG/ML IO SOLN
INTRAOCULAR | Status: DC | PRN
  Administered 2017-03-02: 0.85 mL via INTRAOCULAR

## 2017-03-02 MED ORDER — BSS IO SOLN
INTRAOCULAR | Status: DC | PRN
  Administered 2017-03-02: 15 mL via INTRAOCULAR

## 2017-03-02 MED ORDER — TETRACAINE HCL 0.5 % OP SOLN
1.0000 [drp] | OPHTHALMIC | Status: AC
  Administered 2017-03-02 (×3): 1 [drp] via OPHTHALMIC

## 2017-03-02 MED ORDER — PHENYLEPHRINE HCL 2.5 % OP SOLN
1.0000 [drp] | OPHTHALMIC | Status: AC
Start: 2017-03-02 — End: 2017-03-02
  Administered 2017-03-02 (×3): 1 [drp] via OPHTHALMIC

## 2017-03-02 MED ORDER — LIDOCAINE HCL 3.5 % OP GEL
1.0000 "application " | Freq: Once | OPHTHALMIC | Status: AC
  Administered 2017-03-02: 1 via OPHTHALMIC

## 2017-03-02 MED ORDER — FENTANYL CITRATE (PF) 100 MCG/2ML IJ SOLN
25.0000 ug | Freq: Once | INTRAMUSCULAR | Status: AC
  Administered 2017-03-02: 25 ug via INTRAVENOUS

## 2017-03-02 MED ORDER — LIDOCAINE HCL (PF) 1 % IJ SOLN
INTRAOCULAR | Status: DC | PRN
  Administered 2017-03-02: 1 mL via OPHTHALMIC

## 2017-03-02 SURGICAL SUPPLY — 14 items

## 2017-03-02 NOTE — H&P (Signed)
The H and P was reviewed and updated. The patient was examined.  No changes were found after exam.  The surgical eye was marked.  

## 2017-03-02 NOTE — Op Note (Signed)
Date of procedure: 03/02/17  Pre-operative diagnosis: Visually significant cataract, Right Eye  Post-operative diagnosis: Visually significant cataract, Right Eye  Procedure: Removal of cataract via phacoemulsification and insertion of intra-ocular lens AMO PCB00  +21.0D into the capsular bag of the Right Eye  Attending surgeon: Gerda Diss. Kiano Terrien, MD, MA  Anesthesia: MAC, Topical Akten  Complications: None  Estimated Blood Loss: <27m (minimal)  Specimens: None  Implants: As above  Indications:  Visually significant cataract, Right Eye  Procedure:  The patient was seen and identified in the pre-operative area. The operative eye was identified and dilated.  The operative eye was marked.  Topical anesthesia was administered to the operative eye.     The patient was then to the operative suite and placed in the supine position.  A timeout was performed confirming the patient, procedure to be performed, and all other relevant information.   The patient's face was prepped and draped in the usual fashion for intra-ocular surgery.  A lid speculum was placed into the operative eye and the surgical microscope moved into place and focused.  A superotemporal paracentesis was created using a 20 gauge paracentesis blade.  Shugarcaine was injected into the anterior chamber.  Viscoelastic was injected into the anterior chamber.  A temporal clear-corneal main wound incision was created using a 2.412mmicrokeratome.  A continuous curvilinear capsulorrhexis was initiated using an irrigating cystitome and completed using capsulorrhexis forceps.  Hydrodissection and hydrodeliniation were performed.  Viscoelastic was injected into the anterior chamber.  A phacoemulsification handpiece and a chopper as a second instrument were used to remove the nucleus and epinucleus. The irrigation/aspiration handpiece was used to remove any remaining cortical material.   The capsular bag was reinflated with viscoelastic,  checked, and found to be intact.  The intraocular lens was inserted into the capsular bag and dialed into place using a Kuglen hook.  The irrigation/aspiration handpiece was used to remove any remaining viscoelastic.  The clear corneal wound and paracentesis wounds were then hydrated and checked with Weck-Cels to be watertight.  The lid-speculum and drape was removed, and the patient's face was cleaned with a wet and dry 4x4.  Maxitrol was instilled in the eye before a clear shield was taped over the eye. The patient was taken to the post-operative care unit in good condition, having tolerated the procedure well.  Post-Op Instructions: [facility] and will receive all other orders and instructions.

## 2017-03-02 NOTE — Transfer of Care (Signed)
Immediate Anesthesia Transfer of Care Note  Patient: [name]  Procedure(s) Performed: CATARACT EXTRACTION PHACO AND INTRAOCULAR LENS PLACEMENT ([place]) (Right Eye)  Patient Location: Short Stay  Anesthesia Type:MAC  Level of Consciousness: awake and patient cooperative  Airway & Oxygen Therapy: Patient Spontanous Breathing  Post-op Assessment: Report given to RN  Post vital signs: Reviewed and stable  Last Vitals:  Vitals:   03/02/17 1010 03/02/17 1015  BP: (!) 151/66 (!) 160/69  Pulse:    Resp: 16 16  Temp:    SpO2: 100% 100%    Last Pain:  Vitals:   03/02/17 0914  TempSrc: Oral         Complications: No apparent anesthesia complications

## 2017-03-02 NOTE — Anesthesia Postprocedure Evaluation (Signed)
Anesthesia Post Note  Patient: [name]  Procedure(s) Performed: CATARACT EXTRACTION PHACO AND INTRAOCULAR LENS PLACEMENT ([place]) (Right Eye)  Patient location during evaluation: Short Stay Anesthesia Type: MAC Level of consciousness: awake and alert Pain management: pain level controlled Vital Signs Assessment: post-procedure vital signs reviewed and stable Respiratory status: spontaneous breathing Cardiovascular status: blood pressure returned to baseline Postop Assessment: no apparent nausea or vomiting and adequate PO intake Anesthetic complications: no     Last Vitals:  Vitals:   03/02/17 1010 03/02/17 1015  BP: (!) 151/66 (!) 160/69  Pulse:    Resp: 16 16  Temp:    SpO2: 100% 100%    Last Pain:  Vitals:   03/02/17 0914  TempSrc: Oral                 [name]

## 2017-03-02 NOTE — Discharge Instructions (Signed)
Please discharge patient when stable, will follow up today with Dr. Wrzosek at the [facility] office immediately following discharge.  Leave shield in place until visit.  All paperwork with discharge instructions will be given at the office. ° ° °PATIENT INSTRUCTIONS °POST-ANESTHESIA ° °IMMEDIATELY FOLLOWING SURGERY:  Do not drive or operate machinery for the first twenty four hours after surgery.  Do not make any important decisions for twenty four hours after surgery or while taking narcotic pain medications or sedatives.  If you develop intractable nausea and vomiting or a severe headache please notify your doctor immediately. ° °FOLLOW-UP:  Please make an appointment with your surgeon as instructed. You do not need to follow up with anesthesia unless specifically instructed to do so. ° °WOUND CARE INSTRUCTIONS (if applicable):  Keep a dry clean dressing on the anesthesia/puncture wound site if there is drainage.  Once the wound has quit draining you may leave it open to air.  Generally you should leave the bandage intact for twenty four hours unless there is drainage.  If the epidural site drains for more than 36-48 hours please call the anesthesia department. ° °QUESTIONS?:  Please feel free to call your physician or the hospital operator if you have any questions, and they will be happy to assist you.    ° ° ° °

## 2017-03-02 NOTE — Anesthesia Preprocedure Evaluation (Signed)
Anesthesia Evaluation  Patient identified by MRN, date of birth, ID band Patient awake    Reviewed: Allergy & Precautions, NPO status , Patient's Chart, lab work & pertinent test results  History of Anesthesia Complications (+) PONV and history of anesthetic complications  Airway Mallampati: I  TM Distance: >3 FB Neck ROM: Full    Dental  (+) Teeth Intact   Pulmonary neg pulmonary ROS,    Pulmonary exam normal        Cardiovascular Exercise Tolerance: Good negative cardio ROS   Rhythm:Regular Rate:Normal  2018 Normal sinus rhythm Septal infarct , age undetermined Abnormal ECG   Neuro/Psych negative neurological ROS  negative psych ROS   GI/Hepatic negative GI ROS, Neg liver ROS,   Endo/Other  negative endocrine ROS  Renal/GU negative Renal ROS     Musculoskeletal negative musculoskeletal ROS (+)   Abdominal   Peds  Hematology negative hematology ROS (+)   Anesthesia Other Findings   Reproductive/Obstetrics                             Anesthesia Physical Anesthesia Plan  ASA: II  Anesthesia Plan: MAC   Post-op Pain Management:    Induction: Intravenous  PONV Risk Score and Plan:   Airway Management Planned: Nasal Cannula  Additional Equipment:   Intra-op Plan:   Post-operative Plan:   Informed Consent: I have reviewed the patients History and Physical, chart, labs and discussed the procedure including the risks, benefits and alternatives for the proposed anesthesia with the patient or authorized representative who has indicated his/her understanding and acceptance.     Plan Discussed with:   Anesthesia Plan Comments:         Anesthesia Quick Evaluation

## 2017-03-05 ENCOUNTER — Encounter (HOSPITAL_COMMUNITY): Payer: Self-pay | Admitting: Ophthalmology

## 2017-05-28 ENCOUNTER — Inpatient Hospital Stay (HOSPITAL_COMMUNITY): Admission: RE | Admit: 2017-05-28 | Payer: Medicare Other | Source: Ambulatory Visit

## 2017-06-01 ENCOUNTER — Ambulatory Visit: Admit: 2017-06-01 | Payer: Medicare Other | Admitting: Ophthalmology

## 2017-06-01 SURGERY — BLEPHAROPLASTY
Anesthesia: Monitor Anesthesia Care | Laterality: Bilateral

## 2018-05-24 ENCOUNTER — Encounter (HOSPITAL_COMMUNITY): Payer: Self-pay

## 2018-05-24 ENCOUNTER — Other Ambulatory Visit: Payer: Self-pay

## 2018-05-24 ENCOUNTER — Observation Stay (HOSPITAL_COMMUNITY)
Admission: EM | Admit: 2018-05-24 | Discharge: 2018-05-25 | Disposition: A | Discharge status: Home / Self Care | Payer: Medicare Other | Attending: Internal Medicine | Admitting: Internal Medicine

## 2018-05-24 ENCOUNTER — Emergency Department (HOSPITAL_COMMUNITY): Payer: Medicare Other

## 2018-05-24 DIAGNOSIS — R9431 Abnormal electrocardiogram [ECG] [EKG]: Secondary | ICD-10-CM | POA: Insufficient documentation

## 2018-05-24 DIAGNOSIS — Z7189 Other specified counseling: Secondary | ICD-10-CM

## 2018-05-24 DIAGNOSIS — D696 Thrombocytopenia, unspecified: Secondary | ICD-10-CM | POA: Insufficient documentation

## 2018-05-24 DIAGNOSIS — Z888 Allergy status to other drugs, medicaments and biological substances status: Secondary | ICD-10-CM | POA: Diagnosis not present

## 2018-05-24 DIAGNOSIS — K589 Irritable bowel syndrome without diarrhea: Secondary | ICD-10-CM | POA: Diagnosis not present

## 2018-05-24 DIAGNOSIS — I4891 Unspecified atrial fibrillation: Principal | ICD-10-CM | POA: Insufficient documentation

## 2018-05-24 DIAGNOSIS — I517 Cardiomegaly: Secondary | ICD-10-CM | POA: Insufficient documentation

## 2018-05-24 DIAGNOSIS — Z66 Do not resuscitate: Secondary | ICD-10-CM | POA: Insufficient documentation

## 2018-05-24 DIAGNOSIS — I700 Atherosclerosis of aorta: Secondary | ICD-10-CM | POA: Diagnosis not present

## 2018-05-24 DIAGNOSIS — R7989 Other specified abnormal findings of blood chemistry: Secondary | ICD-10-CM | POA: Diagnosis not present

## 2018-05-24 DIAGNOSIS — I340 Nonrheumatic mitral (valve) insufficiency: Secondary | ICD-10-CM | POA: Insufficient documentation

## 2018-05-24 DIAGNOSIS — Z79899 Other long term (current) drug therapy: Secondary | ICD-10-CM | POA: Insufficient documentation

## 2018-05-24 DIAGNOSIS — K649 Unspecified hemorrhoids: Secondary | ICD-10-CM | POA: Diagnosis not present

## 2018-05-24 LAB — MRSA PCR SCREENING: MRSA by PCR: NEGATIVE

## 2018-05-24 LAB — CBC WITH DIFFERENTIAL/PLATELET
ABS IMMATURE GRANULOCYTES: 0 10*3/uL (ref 0.00–0.07)
BASOS PCT: 0 %
Basophils Absolute: 0 10*3/uL (ref 0.0–0.1)
Eosinophils Absolute: 0.1 10*3/uL (ref 0.0–0.5)
Eosinophils Relative: 1 %
HCT: 40.5 % (ref 36.0–46.0)
HEMOGLOBIN: 12.4 g/dL (ref 12.0–15.0)
IMMATURE GRANULOCYTES: 0 %
LYMPHS ABS: 0.9 10*3/uL (ref 0.7–4.0)
LYMPHS PCT: 21 %
MCH: 29.1 pg (ref 26.0–34.0)
MCHC: 30.6 g/dL (ref 30.0–36.0)
MCV: 95.1 fL (ref 80.0–100.0)
MONOS PCT: 9 %
Monocytes Absolute: 0.4 10*3/uL (ref 0.1–1.0)
NEUTROS ABS: 3 10*3/uL (ref 1.7–7.7)
NEUTROS PCT: 69 %
PLATELETS: 77 10*3/uL — AB (ref 150–400)
RBC: 4.26 MIL/uL (ref 3.87–5.11)
RDW: 13.7 % (ref 11.5–15.5)
WBC: 4.4 10*3/uL (ref 4.0–10.5)
nRBC: 0 % (ref 0.0–0.2)

## 2018-05-24 LAB — URINALYSIS, ROUTINE W REFLEX MICROSCOPIC
Bacteria, UA: NONE SEEN
Bilirubin Urine: NEGATIVE
Glucose, UA: NEGATIVE mg/dL
KETONES UR: NEGATIVE mg/dL
LEUKOCYTES UA: NEGATIVE
NITRITE: NEGATIVE
PROTEIN: NEGATIVE mg/dL
Specific Gravity, Urine: 1.003 — ABNORMAL LOW (ref 1.005–1.030)
pH: 8 (ref 5.0–8.0)

## 2018-05-24 LAB — COMPREHENSIVE METABOLIC PANEL
ALT: 16 U/L (ref 0–44)
ANION GAP: 5 (ref 5–15)
AST: 19 U/L (ref 15–41)
Albumin: 3.6 g/dL (ref 3.5–5.0)
Alkaline Phosphatase: 59 U/L (ref 38–126)
BUN: 14 mg/dL (ref 8–23)
CHLORIDE: 107 mmol/L (ref 98–111)
CO2: 27 mmol/L (ref 22–32)
CREATININE: 0.79 mg/dL (ref 0.44–1.00)
Calcium: 9.1 mg/dL (ref 8.9–10.3)
GFR calc non Af Amer: >60 mL/min (ref >60)
Glucose, Bld: 110 mg/dL — ABNORMAL HIGH (ref 70–99)
Potassium: 3.8 mmol/L (ref 3.5–5.1)
SODIUM: 139 mmol/L (ref 135–145)
Total Bilirubin: 0.7 mg/dL (ref 0.3–1.2)
Total Protein: 6.6 g/dL (ref 6.5–8.1)

## 2018-05-24 LAB — MAGNESIUM: Magnesium: 2 mg/dL (ref 1.7–2.4)

## 2018-05-24 LAB — TROPONIN I
TROPONIN I: 0.04 ng/mL — AB (ref <0.03)
Troponin I: 0.63 ng/mL (ref <0.03)
Troponin I: 0.62 ng/mL (ref <0.03)

## 2018-05-24 LAB — TSH: TSH: 4.351 u[IU]/mL (ref 0.350–4.500)

## 2018-05-24 LAB — APTT: aPTT: 34 seconds (ref 24–36)

## 2018-05-24 MED ORDER — RISAQUAD PO CAPS
1.0000 | ORAL_CAPSULE | Freq: Every day | ORAL | Status: DC
  Administered 2018-05-24 – 2018-05-25 (×2): 1 via ORAL
  Filled 2018-05-24 (×2): qty 1

## 2018-05-24 MED ORDER — POLYVINYL ALCOHOL 1.4 % OP SOLN: 1.0000 [drp] | Freq: Two times a day (BID) | OPHTHALMIC | Status: DC | PRN

## 2018-05-24 MED ORDER — HYDROCORTISONE 2.5 % RE CREA
1.0000 "application " | TOPICAL_CREAM | Freq: Two times a day (BID) | RECTAL | Status: DC | PRN
  Filled 2018-05-24: qty 28.35

## 2018-05-24 MED ORDER — DILTIAZEM HCL 25 MG/5ML IV SOLN
10.0000 mg | Freq: Once | INTRAVENOUS | Status: AC
  Administered 2018-05-24: 10 mg via INTRAVENOUS
  Filled 2018-05-24: qty 5

## 2018-05-24 MED ORDER — SODIUM CHLORIDE 0.9 % IV BOLUS
500.0000 mL | Freq: Once | INTRAVENOUS | Status: AC
  Administered 2018-05-24: 500 mL via INTRAVENOUS

## 2018-05-24 MED ORDER — HEPARIN BOLUS VIA INFUSION
3500.0000 [IU] | Freq: Once | INTRAVENOUS | Status: AC
  Administered 2018-05-24: 3500 [IU] via INTRAVENOUS

## 2018-05-24 MED ORDER — ALPRAZOLAM 0.25 MG PO TABS: 0.1250 mg | ORAL_TABLET | Freq: Every day | ORAL | Status: DC | PRN

## 2018-05-24 MED ORDER — CITALOPRAM HYDROBROMIDE 20 MG PO TABS
10.0000 mg | ORAL_TABLET | Freq: Every day | ORAL | Status: DC
  Administered 2018-05-24 – 2018-05-25 (×2): 10 mg via ORAL
  Filled 2018-05-24 (×2): qty 1

## 2018-05-24 MED ORDER — HEPARIN (PORCINE) 25000 UT/250ML-% IV SOLN
700.0000 [IU]/h | INTRAVENOUS | Status: DC
  Filled 2018-05-24: qty 250

## 2018-05-24 MED ORDER — DILTIAZEM HCL 100 MG IV SOLR
5.0000 mg/h | INTRAVENOUS | Status: DC
  Administered 2018-05-24: 5 mg/h via INTRAVENOUS
  Filled 2018-05-24: qty 100

## 2018-05-24 MED ORDER — HYOSCYAMINE SULFATE 0.125 MG SL SUBL: 0.1250 mg | SUBLINGUAL_TABLET | Freq: Four times a day (QID) | SUBLINGUAL | Status: DC | PRN

## 2018-05-24 MED ORDER — DILTIAZEM HCL 30 MG PO TABS
30.0000 mg | ORAL_TABLET | Freq: Four times a day (QID) | ORAL | Status: DC
  Administered 2018-05-24: 30 mg via ORAL
  Filled 2018-05-24: qty 1

## 2018-05-24 NOTE — ED Notes (Signed)
Date and time results received: 05/24/18 0920 (use smartphrase ".now" to insert current time)  Test: trop Critical Value: 0.04  Name of Provider Notified: yelverton  Orders Received? Or Actions Taken?: see chart

## 2018-05-24 NOTE — H&P (Signed)
History and Physical    [place] FWY:[id number] DOB: August 12, 1932 DOA: 05/24/2018  I have briefly reviewed the patient's prior medical records in [place]  PCP: Caryl Bis, MD  Patient coming from: Home  Chief Complaint: Palpitations, weakness  HPI: [name] is a 85 y.o. female without significant medical problems other than cataracts, hemorrhoids, IBS who presents to the hospital with chief complaint of palpitations.  Patient has been there when she woke up this morning she felt fluttering in her chest, also has a little bit of chest discomfort as well as weakness.  This is never happened to her before, and normally is quite active, able to do all her ADLs without any difficulties, ambulating without any assistive devices.  She denies any fever or chills.  She states she felt a little bit short of breath when palpitations were there but no longer short of breath currently.  She denies abdominal pain, nausea or vomiting.  She denies any flulike illness, fever chills or sick contacts.  Other than the palpitations this morning is otherwise at baseline.  She denies any personal history of heart disease, she denies any recent lower extremity swelling or dyspnea on exertion.  ED Course: In the ED she was found to be in A. fib with RVR, blood work fairly unremarkable except for thrombocytopenia.  Cardiology was consulted, patient was placed on a Cardizem infusion and we are asked to admit  Review of Systems: As per HPI otherwise 10 point review of systems negative.   Past Medical History:  Diagnosis Date  . IBS (irritable bowel syndrome)   . PONV (postoperative nausea and vomiting)     Past Surgical History:  Procedure Laterality Date  . CATARACT EXTRACTION W/PHACO Left 02/02/2017   Procedure: CATARACT EXTRACTION PHACO AND INTRAOCULAR LENS PLACEMENT (IOC);  Surgeon: Baruch Goldmann, MD;  Location: [facility];  Service: Ophthalmology;  Laterality: Left;  CDE: 14.49  .  CATARACT EXTRACTION W/PHACO Right 03/02/2017   Procedure: CATARACT EXTRACTION PHACO AND INTRAOCULAR LENS PLACEMENT ([place]);  Surgeon: Baruch Goldmann, MD;  Location: [facility];  Service: Ophthalmology;  Laterality: Right;  CDE: 12.29  . COLONOSCOPY N/A 07/24/2014   Procedure: COLONOSCOPY;  Surgeon: Rogene Houston, MD;  Location: [facility];  Service: Endoscopy;  Laterality: N/A;  900  . HEMORRHOID SURGERY    . TONSILLECTOMY       reports that she has never smoked. She has never used smokeless tobacco. She reports that she does not drink alcohol or use drugs.  Allergies  Allergen Reactions  . Mercury Other (See Comments)    unknown    History reviewed. No pertinent family history.  Prior to Admission medications   Medication Sig Start Date End Date Taking? Authorizing Provider  ALPRAZolam (XANAX) 0.25 MG tablet Take 0.125 mg by mouth daily as needed for anxiety or sleep.    Yes [provider]  BIOTIN PO Take 1 tablet by mouth 2 (two) times daily.   Yes [provider]  Calcium Carbonate-Vitamin D (CALTRATE COLON HEALTH PO) Take 2 tablets by mouth daily.    Yes [provider]  carboxymethylcellulose (REFRESH PLUS) 0.5 % SOLN 1 drop 2 (two) times daily as needed.   Yes [provider]  Cholecalciferol 1000 units capsule Take 1,000 Units by mouth daily.    Yes [provider]  citalopram (CELEXA) 20 MG tablet Take 10 mg by mouth daily.    Yes [provider]  hydrocortisone (ANUSOL-HC) 2.5 %  rectal cream Place 1 application rectally 2 (two) times daily as needed for hemorrhoids.    Yes [provider]  hyoscyamine (LEVSIN SL) 0.125 MG SL tablet PLACE 1 TABLET UNDER TONGUE THREE TIMES DAILY AS NEEDED Patient taking differently: PLACE 1 TABLET UNDER TONGUE THREE TIMES DAILY AS NEEDED FOR IBS 04/18/13  Yes Rehman, Mechele Dawley, MD  Lactobacillus-Inulin ([place] PO) Take 1 tablet by mouth daily.   Yes [provider]  loratadine-pseudoephedrine (CLARITIN-D 24-HOUR) 10-240 MG 24 hr tablet Take 1 tablet by mouth daily.   Yes [provider]  SF 5000 PLUS 1.1 % CREA dental cream USE IN PLACE OF REGULAR TOOTHPASTE, BRUSH BEFORE BEDTIME FOR TWO MINUTES, THEN EXPECTORATE. NO RINSING, EATING OR DRINKING FOR 30 MINUTES. 12/23/16  Yes [provider]    Physical Exam: Vitals:   05/24/18 1200 05/24/18 1230 05/24/18 1300 05/24/18 1330  BP: 117/70 (!) 83/71 116/63 116/82  Pulse: (!) 129 84 95 (!) 154  Resp: (!) 22 (!) 21 18 19   Temp:      TempSrc:      SpO2: 96% 95% 93% 95%  Weight:      Height:          Constitutional: NAD, calm, comfortable Eyes: PERRL, lids and conjunctivae normal ENMT: Mucous membranes are moist. Posterior pharynx clear of any exudate or lesions.Normal dentition.  Neck: normal, supple, no masses, no thyromegaly Respiratory: clear to auscultation bilaterally, no wheezing, no crackles. Normal respiratory effort. No accessory muscle use.  Cardiovascular: Irregularly irregular, tachycardic, no murmurs appreciated.  No peripheral edema Abdomen: no tenderness, no masses palpated. Bowel sounds positive.  Musculoskeletal: no clubbing / cyanosis. Normal muscle tone.  Skin: no rashes, lesions, ulcers. No induration Neurologic: CN 2-12 grossly intact. Strength 5/5 in all 4.  Psychiatric: Normal judgment and insight. Alert and oriented x 3. Normal mood.   Labs on Admission: I have personally reviewed following labs and imaging studies  CBC: Recent Labs  Lab 05/24/18 0853  WBC 4.4  NEUTROABS 3.0  HGB 12.4  HCT 40.5  MCV 95.1  PLT 77*   Basic Metabolic Panel: Recent Labs  Lab 05/24/18 0853  NA 139  K 3.8  CL 107  CO2 27  GLUCOSE 110*  BUN 14  CREATININE 0.79  CALCIUM 9.1  MG 2.0   GFR: Estimated Creatinine Clearance: 46.9 mL/min (by C-G formula based on SCr of 0.79 mg/dL). Liver Function Tests: Recent Labs  Lab 05/24/18 0853  AST 19  ALT  16  ALKPHOS 59  BILITOT 0.7  PROT 6.6  ALBUMIN 3.6   No results for input(s): LIPASE, AMYLASE in the last 168 hours. No results for input(s): AMMONIA in the last 168 hours. Coagulation Profile: No results for input(s): INR, PROTIME in the last 168 hours. Cardiac Enzymes: Recent Labs  Lab 05/24/18 0853  TROPONINI 0.04*   BNP (last 3 results) No results for input(s): PROBNP in the last 8760 hours. HbA1C: No results for input(s): HGBA1C in the last 72 hours. CBG: No results for input(s): GLUCAP in the last 168 hours. Lipid Profile: No results for input(s): CHOL, HDL, LDLCALC, TRIG, CHOLHDL, LDLDIRECT in the last 72 hours. Thyroid Function Tests: Recent Labs    05/24/18 0853  TSH 4.351   Anemia Panel: No results for input(s): VITAMINB12, FOLATE, FERRITIN, TIBC, IRON, RETICCTPCT in the last 72 hours. Urine analysis:    Component Value Date/Time   COLORURINE COLORLESS (A) 05/24/2018 0843   APPEARANCEUR CLEAR 05/24/2018 6440  LABSPEC 1.003 (L) 05/24/2018 0843   PHURINE 8.0 05/24/2018 0843   GLUCOSEU NEGATIVE 05/24/2018 0843   HGBUR SMALL (A) 05/24/2018 0843   BILIRUBINUR NEGATIVE 05/24/2018 0843   KETONESUR NEGATIVE 05/24/2018 0843   PROTEINUR NEGATIVE 05/24/2018 0843   NITRITE NEGATIVE 05/24/2018 0843   LEUKOCYTESUR NEGATIVE 05/24/2018 0843     Radiological Exams on Admission: Dg Chest Port 1 View  Result Date: 05/24/2018 CLINICAL DATA:  Cardiac palpitations EXAM: PORTABLE CHEST 1 VIEW COMPARISON:  None. FINDINGS: There is no edema or consolidation. There is cardiomegaly with pulmonary vascularity normal. No adenopathy. There is aortic atherosclerosis. No bone lesions. IMPRESSION: Cardiomegaly. Aortic atherosclerosis.  No edema or consolidation. Aortic Atherosclerosis (ICD10-I70.0). Electronically Signed   By: Lowella Grip III M.D.   On: 05/24/2018 09:04    EKG: Independently reviewed.  A. fib with RVR  Assessment/Plan Active Problems:   Atrial fibrillation  with RVR (HCC)   Principal Problem A. fib with RVR -This appears to be new onset this morning, patient had no history of palpitations or any other chest discomfort in the past. -She was started on Cardizem infusion, continue, admit to stepdown -No history of bleeding, discussed with patient and family -CHADS-VASc score is 3 for age, female, she would benefit from anticoagulation, this was discussed with family, risks benefits, they agree with anticoagulation.  She has no history of falls, no history of GI bleed or any other bleeding issues.  We will start heparin infusion overnight given thrombocytopenia, if CBC remains stable can probably do Eliquis tomorrow -Obtain a 2D echo  Active Problems Thrombocytopenia -This is chronic, patient is aware of this and has been told by PCP that this is been going on for a while and has remained stable over the years.  Will closely monitor while anticoagulated as above  IBS -Resume home medications  Troponin elevation -Likely demand ischemia in the setting of A. fib with RVR.  Will trend to ensure flat plateau   DVT prophylaxis: heparin infusion  Code Status: DNR  Family Communication: Husband, son and daughter-in-law present at bedside Disposition Plan: Admit to stepdown Consults called: Cardiology   Marzetta Board, MD, PhD [facility]  Contact via [email or web address]  [facility] Office Info P: [phone]  F: [phone]   05/24/2018, 3:17 PM

## 2018-05-24 NOTE — ED Notes (Signed)
Dr Darreld Mclean in to speak w pt and fam

## 2018-05-24 NOTE — Progress Notes (Signed)
CRITICAL VALUE ALERT  Critical Value:  Troponin 0.63  Date & Time Notied:  05/24/2018 @ 0998  Provider Notified: Dr. Cruzita Lederer  Orders Received/Actions taken: Continue new orders put in by Dr. Bronson Ing

## 2018-05-24 NOTE — Consult Note (Signed)
Cardiology Consultation:   Patient ID: [name] MRN: [id number]; DOB: Aug 25, 1932  Admit date: 05/24/2018 Date of Consult: 05/24/2018  Primary Care Provider: Caryl Bis, MD Primary Cardiologist: Kate Sable, MD (New) Primary Electrophysiologist:  None    Patient Profile:   [name] is a 85 y.o. female with no significant medical history who is being seen today for the evaluation of new onset rapid atrial fibrillation at the request of Dr. Renne Crigler.  History of Present Illness:   Ms. [name] is an 85 year old female with no significant past medical history.  At approximately 6 AM today she went to the bathroom and came back and lie down on her left side.  She then experienced rapid palpitations.  She tried changing positions in the bed and symptoms continued for the next hour.  She tried checking her blood pressure but it would not register.  Her husband checked his blood pressure and the cuff was found to be working okay so she knew something was wrong.  She said she broke out in a sweat.  She denies associated chest pain, dizziness, and shortness of breath.  She says energy levels have been stable over the last 6 months.  She denies any prior episodes of palpitations.  Several family members were in the room at the time of my evaluation.  She came to the ED and was found to be in rapid atrial fibrillation.  She was started on an IV diltiazem drip and IV heparin.  At the time my evaluation, telemetry demonstrates sinus rhythm with PACs.  Magnesium, urinalysis, comprehensive metabolic panel, and TSH were unremarkable.  Troponin minimally elevated at 0.04.  Platelets low at 77,000.  Chest x-ray shows cardiomegaly, aortic atherosclerosis, and no edema or consolidation.  I personally reviewed the ECG performed today at 8:34 AM which showed rapid atrial fibrillation with consequent repolarization abnormalities, 153 bpm.   Past Medical History:  Diagnosis Date  . IBS  (irritable bowel syndrome)   . PONV (postoperative nausea and vomiting)     Past Surgical History:  Procedure Laterality Date  . CATARACT EXTRACTION W/PHACO Left 02/02/2017   Procedure: CATARACT EXTRACTION PHACO AND INTRAOCULAR LENS PLACEMENT ([facility]);  Surgeon: Baruch Goldmann, MD;  Location: [facility];  Service: Ophthalmology;  Laterality: Left;  CDE: 14.49  . CATARACT EXTRACTION W/PHACO Right 03/02/2017   Procedure: CATARACT EXTRACTION PHACO AND INTRAOCULAR LENS PLACEMENT ([place]);  Surgeon: Baruch Goldmann, MD;  Location: [facility];  Service: Ophthalmology;  Laterality: Right;  CDE: 12.29  . COLONOSCOPY N/A 07/24/2014   Procedure: COLONOSCOPY;  Surgeon: Rogene Houston, MD;  Location[facility];  Service: Endoscopy;  Laterality: N/A;  900  . HEMORRHOID SURGERY    . TONSILLECTOMY         Inpatient Medications: Scheduled Meds: . acidophilus  1 capsule Oral Daily  . citalopram  10 mg Oral Daily   Continuous Infusions: . diltiazem (CARDIZEM) infusion 6 mg/hr (05/24/18 1307)  . heparin 700 Units/hr (05/24/18 1550)   PRN Meds: ALPRAZolam, hydrocortisone, hyoscyamine, polyvinyl alcohol  Allergies:    Allergies  Allergen Reactions  . Mercury Other (See Comments)    unknown    Social History:   Social History   Socioeconomic History  . Marital status: Married    Spouse name: Not on file  . Number of children: Not on file  . Years of education: Not on file  . Highest education level: Not on file  Occupational History  . Not on file  Social  Needs  . Financial resource strain: Not on file  . Food insecurity:    Worry: Not on file    Inability: Not on file  . Transportation needs:    Medical: Not on file    Non-medical: Not on file  Tobacco Use  . Smoking status: Never Smoker  . Smokeless tobacco: Never Used  Substance and Sexual Activity  . Alcohol use: No    Alcohol/week: 0.0 standard drinks  . Drug use: No  . Sexual activity: Not on file  Lifestyle  . Physical activity:     Days per week: Not on file    Minutes per session: Not on file  . Stress: Not on file  Relationships  . Social connections:    Talks on phone: Not on file    Gets together: Not on file    Attends religious service: Not on file    Active member of club or organization: Not on file    Attends meetings of clubs or organizations: Not on file    Relationship status: Not on file  . Intimate partner violence:    Fear of current or ex partner: Not on file    Emotionally abused: Not on file    Physically abused: Not on file    Forced sexual activity: Not on file  Other Topics Concern  . Not on file  Social History Narrative  . Not on file    Family History:   History reviewed. No pertinent family history.   ROS:  Please see the history of present illness.   All other ROS reviewed and negative.     Physical Exam/Data:   Vitals:   05/24/18 1530 05/24/18 1545 05/24/18 1600 05/24/18 1630  BP: 124/78 110/61 111/71   Pulse: 75 (!) 163 91   Resp: (!) 21 13 (!) 21   Temp:    98.1 F (36.7 C)  TempSrc:    Oral  SpO2: 99% 98% 98%   Weight:      Height:       No intake or output data in the 24 hours ending 05/24/18 1648 Last 3 Weights 05/24/2018 01/12/2017 07/24/2014  Weight (lbs) 123 lb 127 lb 121 lb  Weight (kg) 55.792 kg 57.607 kg 54.885 kg     Body mass index is 20.47 kg/m.  General:  Well nourished, well developed, in no acute distress HEENT: normal Lymph: no adenopathy Neck: no JVD Endocrine:  No thryomegaly Vascular: No carotid bruits  Cardiac:  normal S1, S2; RRR; no murmur  Lungs:  clear to auscultation bilaterally, no wheezing, rhonchi or rales  Abd: soft, nontender, no hepatomegaly  Ext: no edema Musculoskeletal:  No deformities, BUE and BLE strength normal and equal Skin: warm and dry  Neuro:  CNs 2-12 intact, no focal abnormalities noted Psych:  Normal affect   EKG:  The EKG was personally reviewed and demonstrates: Reviewed above, rapid atrial fibrillation,  153 bpm, rate related repolarization abnormalities Telemetry:  Telemetry was personally reviewed and demonstrates: Sinus rhythm with PACs  Relevant CV Studies: None  Laboratory Data:  Chemistry Recent Labs  Lab 05/24/18 0853  NA 139  K 3.8  CL 107  CO2 27  GLUCOSE 110*  BUN 14  CREATININE 0.79  CALCIUM 9.1  GFRNONAA >60  GFRAA >60  ANIONGAP 5    Recent Labs  Lab 05/24/18 0853  PROT 6.6  ALBUMIN 3.6  AST 19  ALT 16  ALKPHOS 59  BILITOT 0.7   Hematology Recent Labs  Lab 05/24/18 0853  WBC 4.4  RBC 4.26  HGB 12.4  HCT 40.5  MCV 95.1  MCH 29.1  MCHC 30.6  RDW 13.7  PLT 77*   Cardiac Enzymes Recent Labs  Lab 05/24/18 0853  TROPONINI 0.04*   No results for input(s): TROPIPOC in the last 168 hours.  BNPNo results for input(s): BNP, PROBNP in the last 168 hours.  DDimer No results for input(s): DDIMER in the last 168 hours.  Radiology/Studies:  Dg Chest Port 1 View  Result Date: 05/24/2018 CLINICAL DATA:  Cardiac palpitations EXAM: PORTABLE CHEST 1 VIEW COMPARISON:  None. FINDINGS: There is no edema or consolidation. There is cardiomegaly with pulmonary vascularity normal. No adenopathy. There is aortic atherosclerosis. No bone lesions. IMPRESSION: Cardiomegaly. Aortic atherosclerosis.  No edema or consolidation. Aortic Atherosclerosis (ICD10-I70.0). Electronically Signed   By: Lowella Grip III M.D.   On: 05/24/2018 09:04    Assessment and Plan:   1.  New onset rapid atrial fibrillation: No obvious precipitating factors. I will order a 2-D echocardiogram with Doppler to evaluate cardiac structure, function, and regional wall motion. Currently on IV diltiazem and IV heparin.  She appears to have converted to sinus rhythm with PACs in the 60 bpm range.  I will obtain a follow-up ECG.  She does have some thrombocytopenia which appears to be chronic.  She has no bleeding issues as per the patient and family members.  She can likely be converted from IV  heparin to apixaban 5 mg twice daily if CBC remains stable. I will transition off IV diltiazem to short acting 30 mg every 6 hours.  If she remains in sinus rhythm I would recommend discharging her on long-acting diltiazem 120 mg daily.  2.  Thrombocytopenia: This appears to be chronic.  This will be monitored while she is hospitalized.  3.  Troponin elevation: Likely indicative of demand ischemia in the context of rapid atrial fibrillation.  She otherwise denies anginal symptoms.  She does not require stress testing at this time.   For questions or updates, please contact [place] Please consult [email or web address] for contact info under     Signed, Kate Sable, MD  05/24/2018 4:48 PM

## 2018-05-24 NOTE — ED Provider Notes (Signed)
[facility] EMERGENCY DEPARTMENT Provider Note   CSN: [id number] Arrival date & time: 05/24/18  9211     History   Chief Complaint Chief Complaint  Patient presents with  . Palpitations    HPI [name] is a 85 y.o. female.  HPI Patient states she woke up around 6 AM this morning with palpitations.  States when she went to stand up at 7 AM she broke out in a sweat.  She complained of very mild dyspnea.  She denies any chest pain.  States that her palpitations have now resolved.  No recent fever or chills.  No new lower extremity swelling or pain. Past Medical History:  Diagnosis Date  . IBS (irritable bowel syndrome)   . PONV (postoperative nausea and vomiting)     Patient Active Problem List   Diagnosis Date Noted  . IBS (irritable bowel syndrome) 07/09/2014    Past Surgical History:  Procedure Laterality Date  . CATARACT EXTRACTION W/PHACO Left 02/02/2017   Procedure: CATARACT EXTRACTION PHACO AND INTRAOCULAR LENS PLACEMENT (IOC);  Surgeon: Baruch Goldmann, MD;  Location: [facility] ORS;  Service: Ophthalmology;  Laterality: Left;  CDE: 14.49  . CATARACT EXTRACTION W/PHACO Right 03/02/2017   Procedure: CATARACT EXTRACTION PHACO AND INTRAOCULAR LENS PLACEMENT ([place]);  Surgeon: Baruch Goldmann, MD;  Location: [facility] ORS;  Service: Ophthalmology;  Laterality: Right;  CDE: 12.29  . COLONOSCOPY N/A 07/24/2014   Procedure: COLONOSCOPY;  Surgeon: Rogene Houston, MD;  Location: [facility] ENDO SUITE;  Service: Endoscopy;  Laterality: N/A;  900  . HEMORRHOID SURGERY    . TONSILLECTOMY       OB History   No obstetric history on file.      Home Medications    Prior to Admission medications   Medication Sig Start Date End Date Taking? Authorizing Provider  ALPRAZolam (XANAX) 0.25 MG tablet Take 0.125 mg by mouth daily as needed for anxiety or sleep.    Yes [provider]  BIOTIN PO Take 1 tablet by mouth 2 (two) times daily.   Yes [provider]  Calcium  Carbonate-Vitamin D (CALTRATE COLON HEALTH PO) Take 2 tablets by mouth daily.    Yes [provider]  carboxymethylcellulose (REFRESH PLUS) 0.5 % SOLN 1 drop 2 (two) times daily as needed.   Yes [provider]  Cholecalciferol 1000 units capsule Take 1,000 Units by mouth daily.    Yes [provider]  citalopram (CELEXA) 20 MG tablet Take 10 mg by mouth daily.    Yes [provider]  hydrocortisone (ANUSOL-HC) 2.5 % rectal cream Place 1 application rectally 2 (two) times daily as needed for hemorrhoids.    Yes [provider]  hyoscyamine (LEVSIN SL) 0.125 MG SL tablet PLACE 1 TABLET UNDER TONGUE THREE TIMES DAILY AS NEEDED Patient taking differently: PLACE 1 TABLET UNDER TONGUE THREE TIMES DAILY AS NEEDED FOR IBS 04/18/13  Yes Rehman, Mechele Dawley, MD  Lactobacillus-Inulin ([place] PO) Take 1 tablet by mouth daily.   Yes [provider]  loratadine-pseudoephedrine (CLARITIN-D 24-HOUR) 10-240 MG 24 hr tablet Take 1 tablet by mouth daily.   Yes [provider]  SF 5000 PLUS 1.1 % CREA dental cream USE IN PLACE OF REGULAR TOOTHPASTE, BRUSH BEFORE BEDTIME FOR TWO MINUTES, THEN EXPECTORATE. NO RINSING, EATING OR DRINKING FOR 30 MINUTES. 12/23/16  Yes [provider]    Family History History reviewed. No pertinent family history.  Social History Social History   Tobacco Use  . Smoking  status: Never Smoker  . Smokeless tobacco: Never Used  Substance Use Topics  . Alcohol use: No    Alcohol/week: 0.0 standard drinks  . Drug use: No     Allergies   Mercury   Review of Systems Review of Systems  Constitutional: Positive for diaphoresis. Negative for chills and fever.  HENT: Negative for sore throat and trouble swallowing.   Eyes: Negative for visual disturbance.  Respiratory: Positive for shortness of breath. Negative for cough.   Cardiovascular: Positive for palpitations. Negative for chest pain and  leg swelling.  Gastrointestinal: Positive for constipation. Negative for abdominal pain, diarrhea, nausea and vomiting.  Genitourinary: Negative for dysuria, flank pain and frequency.  Musculoskeletal: Negative for back pain, myalgias and neck pain.  Skin: Negative for rash and wound.  Neurological: Negative for dizziness, syncope, weakness, light-headedness, numbness and headaches.  All other systems reviewed and are negative.    Physical Exam Updated Vital Signs BP 116/82   Pulse (!) 154   Temp 98.1 F (36.7 C) (Oral)   Resp 19   Ht 5\' 5"  (1.651 m)   Wt 55.8 kg   SpO2 95%   BMI 20.47 kg/m   Physical Exam Vitals signs and nursing note reviewed.  Constitutional:      General: She is not in acute distress.    Appearance: Normal appearance. She is well-developed. She is not ill-appearing.  HENT:     Head: Normocephalic and atraumatic.     Nose: Nose normal.     Mouth/Throat:     Mouth: Mucous membranes are moist.  Eyes:     Extraocular Movements: Extraocular movements intact.     Conjunctiva/sclera: Conjunctivae normal.     Pupils: Pupils are equal, round, and reactive to light.  Neck:     Musculoskeletal: Normal range of motion and neck supple. No neck rigidity or muscular tenderness.     Vascular: No carotid bruit.  Cardiovascular:     Rate and Rhythm: Tachycardia present. Rhythm irregular.     Heart sounds: No murmur. No friction rub. No gallop.   Pulmonary:     Effort: Pulmonary effort is normal. No respiratory distress.     Breath sounds: Normal breath sounds. No stridor. No wheezing, rhonchi or rales.  Chest:     Chest wall: No tenderness.  Abdominal:     General: Bowel sounds are normal.     Palpations: Abdomen is soft.     Tenderness: There is no abdominal tenderness. There is no guarding or rebound.  Musculoskeletal: Normal range of motion.        General: No swelling, tenderness, deformity or signs of injury.     Right lower leg: No edema.     Left lower  leg: No edema.  Lymphadenopathy:     Cervical: No cervical adenopathy.  Skin:    General: Skin is warm and dry.     Findings: No erythema or rash.  Neurological:     General: No focal deficit present.     Mental Status: She is alert and oriented to person, place, and time.     Comments: Moving all extremities without focal deficit.  Sensation intact.  Psychiatric:        Mood and Affect: Mood normal.        Behavior: Behavior normal.      ED Treatments / Results  Labs (all labs ordered are listed, but only abnormal results are displayed) Labs Reviewed  CBC WITH DIFFERENTIAL/PLATELET - Abnormal; Notable for the  following components:      Result Value   Platelets 77 (*)    All other components within normal limits  COMPREHENSIVE METABOLIC PANEL - Abnormal; Notable for the following components:   Glucose, Bld 110 (*)    All other components within normal limits  URINALYSIS, ROUTINE W REFLEX MICROSCOPIC - Abnormal; Notable for the following components:   Color, Urine COLORLESS (*)    Specific Gravity, Urine 1.003 (*)    Hgb urine dipstick SMALL (*)    All other components within normal limits  TROPONIN I - Abnormal; Notable for the following components:   Troponin I 0.04 (*)    All other components within normal limits  TSH  MAGNESIUM    EKG EKG Interpretation  Date/Time:  Friday May 24 2018 08:34:18 EST Ventricular Rate:  153 PR Interval:    QRS Duration: 81 QT Interval:  254 QTC Calculation: 406 R Axis:   75 Text Interpretation:  Atrial fibrillation with rapid V-rate Repolarization abnormality, prob rate related Confirmed by Julianne Rice [phone]) on 05/24/2018 8:46:03 AM   Radiology Dg Chest Port 1 View  Result Date: 05/24/2018 CLINICAL DATA:  Cardiac palpitations EXAM: PORTABLE CHEST 1 VIEW COMPARISON:  None. FINDINGS: There is no edema or consolidation. There is cardiomegaly with pulmonary vascularity normal. No adenopathy. There is aortic  atherosclerosis. No bone lesions. IMPRESSION: Cardiomegaly. Aortic atherosclerosis.  No edema or consolidation. Aortic Atherosclerosis (ICD10-I70.0). Electronically Signed   By: Lowella Grip III M.D.   On: 05/24/2018 09:04    Procedures Procedures (including critical care time)  Medications Ordered in ED Medications  diltiazem (CARDIZEM) 100 mg in dextrose 5 % 100 mL (1 mg/mL) infusion (6 mg/hr Intravenous Rate/Dose Change 05/24/18 1307)  diltiazem (CARDIZEM) injection 10 mg (10 mg Intravenous Given 05/24/18 0857)  sodium chloride 0.9 % bolus 500 mL (500 mLs Intravenous New Bag/Given 05/24/18 1022)    CRITICAL CARE Performed by: Julianne Rice Total critical care time: 35 minutes Critical care time was exclusive of separately billable procedures and treating other patients. Critical care was necessary to treat or prevent imminent or life-threatening deterioration. Critical care was time spent personally by me on the following activities: development of treatment plan with patient and/or surrogate as well as nursing, discussions with consultants, evaluation of patient's response to treatment, examination of patient, obtaining history from patient or surrogate, ordering and performing treatments and interventions, ordering and review of laboratory studies, ordering and review of radiographic studies, pulse oximetry and re-evaluation of patient's condition. Initial Impression / Assessment and Plan / ED Course  I have reviewed the triage vital signs and the nursing notes.  Pertinent labs & imaging results that were available during my care of the patient were reviewed by me and considered in my medical decision making (see chart for details).     Given dose of Cardizem with some improvement of heart rate initially but with increased back to the 140s.  Started on Cardizem drip.  Discussed with Dr. Bronson Ing.  Will consult on patient. Spoke with hospitalist who will admit. Final Clinical  Impressions(s) / ED Diagnoses   Final diagnoses:  Atrial fibrillation with rapid ventricular response [facility])    ED Discharge Orders    None       Julianne Rice, MD 05/24/18 1450

## 2018-05-24 NOTE — ED Triage Notes (Signed)
Pt reports that she got up at 6am to go to [facility] and when she got back In bed she laid on her left side and her heart felt like it was racing appox 2 minutes. Pt states she took stool softener last night and was able to have BM. Reports unable to get her BP and she broke out in a sweat

## 2018-05-24 NOTE — Progress Notes (Signed)
ANTICOAGULATION CONSULT NOTE - Initial Consult  Pharmacy Consult for Heparin Indication: atrial fibrillation  Allergies  Allergen Reactions  . Mercury Other (See Comments)    unknown    Patient Measurements: Height: 5\' 5"  (165.1 cm) Weight: 123 lb (55.8 kg) IBW/kg (Calculated) : 57 HEPARIN DW (KG): 55.8  Vital Signs: Temp: 98.1 F (36.7 C) (01/10 0828) Temp Source: Oral (01/10 0828) BP: 116/82 (01/10 1330) Pulse Rate: 154 (01/10 1330)  Labs: Recent Labs    05/24/18 0853  HGB 12.4  HCT 40.5  PLT 77*  CREATININE 0.79  TROPONINI 0.04*    Estimated Creatinine Clearance: 46.9 mL/min (by C-G formula based on SCr of 0.79 mg/dL).   Medical History: Past Medical History:  Diagnosis Date  . IBS (irritable bowel syndrome)   . PONV (postoperative nausea and vomiting)     Medications:  See med rec  Assessment: Patient presented to ED with palpitations. Pharmacy asked to start heparin.  Goal of Therapy:  Heparin level 0.3-0.7 units/ml Monitor platelets by anticoagulation protocol: Yes   Plan:  Give 3500 units bolus x 1 Start heparin infusion at 700 units/hr Check anti-Xa level in ~8 hours and daily while on heparin Continue to monitor H&H and platelets  Isac Sarna, BS [place], BCPS Clinical Pharmacist Pager [phone] 05/24/2018,3:22 PM

## 2018-05-24 NOTE — ED Notes (Signed)
Report to Benjamine Mola, RN, ICU

## 2018-05-24 NOTE — ED Notes (Signed)
Per Dr Darreld Mclean hold pt meal at this time

## 2018-05-24 NOTE — ED Notes (Signed)
Patient insisted on walking to bathroom.  Heart rate went from 130 to 160.

## 2018-05-24 NOTE — ED Notes (Signed)
Call for report 

## 2018-05-25 ENCOUNTER — Observation Stay (HOSPITAL_BASED_OUTPATIENT_CLINIC_OR_DEPARTMENT_OTHER): Payer: Medicare Other

## 2018-05-25 DIAGNOSIS — I340 Nonrheumatic mitral (valve) insufficiency: Secondary | ICD-10-CM | POA: Diagnosis not present

## 2018-05-25 DIAGNOSIS — K649 Unspecified hemorrhoids: Secondary | ICD-10-CM

## 2018-05-25 DIAGNOSIS — Z7901 Long term (current) use of anticoagulants: Secondary | ICD-10-CM | POA: Diagnosis not present

## 2018-05-25 DIAGNOSIS — I4891 Unspecified atrial fibrillation: Secondary | ICD-10-CM | POA: Diagnosis not present

## 2018-05-25 LAB — BASIC METABOLIC PANEL
Anion gap: 5 (ref 5–15)
BUN: 14 mg/dL (ref 8–23)
CO2: 25 mmol/L (ref 22–32)
Calcium: 8.9 mg/dL (ref 8.9–10.3)
Chloride: 108 mmol/L (ref 98–111)
Creatinine, Ser: 0.75 mg/dL (ref 0.44–1.00)
GFR calc Af Amer: >60 mL/min (ref >60)
Glucose, Bld: 98 mg/dL (ref 70–99)
Potassium: 3.6 mmol/L (ref 3.5–5.1)
Sodium: 138 mmol/L (ref 135–145)

## 2018-05-25 LAB — CBC
HCT: 39.3 % (ref 36.0–46.0)
Hemoglobin: 12 g/dL (ref 12.0–15.0)
MCH: 29.1 pg (ref 26.0–34.0)
MCHC: 30.5 g/dL (ref 30.0–36.0)
MCV: 95.4 fL (ref 80.0–100.0)
PLATELETS: 89 10*3/uL — AB (ref 150–400)
RBC: 4.12 MIL/uL (ref 3.87–5.11)
RDW: 13.9 % (ref 11.5–15.5)
WBC: 5.9 10*3/uL (ref 4.0–10.5)
nRBC: 0 % (ref 0.0–0.2)

## 2018-05-25 LAB — ECHOCARDIOGRAM COMPLETE
Height: 65 in
Weight: 1957.68 oz

## 2018-05-25 LAB — HEPARIN LEVEL (UNFRACTIONATED)
HEPARIN UNFRACTIONATED: 0.65 [IU]/mL (ref 0.30–0.70)
Heparin Unfractionated: 0.67 IU/mL (ref 0.30–0.70)

## 2018-05-25 LAB — TROPONIN I: Troponin I: 0.49 ng/mL (ref <0.03)

## 2018-05-25 MED ORDER — APIXABAN 5 MG PO TABS: 5.0000 mg | ORAL_TABLET | Freq: Two times a day (BID) | ORAL | 1 refills | Status: DC

## 2018-05-25 MED ORDER — DILTIAZEM HCL ER COATED BEADS 120 MG PO CP24: 120.0000 mg | ORAL_CAPSULE | Freq: Every day | ORAL | 1 refills | Status: DC

## 2018-05-25 MED ORDER — APIXABAN 2.5 MG PO TABS
2.5000 mg | ORAL_TABLET | Freq: Two times a day (BID) | ORAL | Status: DC
  Administered 2018-05-25: 2.5 mg via ORAL
  Filled 2018-05-25: qty 1

## 2018-05-25 MED ORDER — APIXABAN 2.5 MG PO TABS: 2.5000 mg | ORAL_TABLET | Freq: Two times a day (BID) | ORAL | 1 refills | Status: AC

## 2018-05-25 MED ORDER — POLYETHYLENE GLYCOL 3350 17 G PO PACK: 17.0000 g | PACK | Freq: Every day | ORAL | 1 refills | Status: DC

## 2018-05-25 MED ORDER — DILTIAZEM HCL ER COATED BEADS 120 MG PO CP24
120.0000 mg | ORAL_CAPSULE | Freq: Every day | ORAL | Status: DC
  Administered 2018-05-25: 120 mg via ORAL
  Filled 2018-05-25: qty 1

## 2018-05-25 NOTE — Progress Notes (Signed)
ANTICOAGULATION CONSULT NOTE - Follow-up Consult  Pharmacy Consult for Heparin Indication: atrial fibrillation  Allergies  Allergen Reactions  . Mercury Other (See Comments)    unknown    Patient Measurements: Height: 5\' 5"  (165.1 cm) Weight: 122 lb 5.7 oz (55.5 kg) IBW/kg (Calculated) : 57 HEPARIN DW (KG): 55.8  Vital Signs: Temp: 97.8 F (36.6 C) (01/11 0348) Temp Source: Oral (01/11 0829) BP: 137/66 (01/11 0700) Pulse Rate: 67 (01/11 0700)  Labs: Recent Labs    05/24/18 0853 05/24/18 1535 05/24/18 1638 05/24/18 2231 05/24/18 2350 05/25/18 0456 05/25/18 0751  HGB 12.4  --   --   --   --  12.0  --   HCT 40.5  --   --   --   --  39.3  --   PLT 77*  --   --   --   --  89*  --   APTT  --  34  --   --   --   --   --   HEPARINUNFRC  --   --   --   --  0.65  --  0.67  CREATININE 0.79  --   --   --   --  0.75  --   TROPONINI 0.04*  --  0.63* 0.62*  --  0.49*  --     Estimated Creatinine Clearance: 46.7 mL/min (by C-G formula based on SCr of 0.75 mg/dL).  Assessment: 85 y.o. F on heparin for afib.Heparin level remains therapeutic.    Goal of Therapy:  Heparin level 0.3-0.7 units/ml Monitor platelets by anticoagulation protocol: Yes   Plan:  Continue heparin at 700 units/hr Check anti-Xa level daily while on heparin Continue to monitor H&H and platelets  Isac Sarna, BS [place], BCPS Clinical Pharmacist Pager [phone] 05/25/2018,9:15 AM

## 2018-05-25 NOTE — Progress Notes (Signed)
Pt discharged from facility in stable condition with stable vital signs. Pt had no complaints of pain. This RN went over discharge instructions with pt and pt verbalized understanding.

## 2018-05-25 NOTE — Progress Notes (Signed)
CRITICAL VALUE ALERT  Critical Value: troponin 0.49  Date & Time Notied:  05/25/2018 0743  Provider Notified: Dr. Marzetta Board, MD   Orders Received/Actions taken: none at this time troponin trending down from yesterday.   Jeris Penta, RN

## 2018-05-25 NOTE — Progress Notes (Signed)
Echocardiogram 2D Echocardiogram has been performed.  Matilde Bash 05/25/2018, 9:14 AM

## 2018-05-25 NOTE — Progress Notes (Signed)
ANTICOAGULATION CONSULT NOTE - Follow-up Consult  Pharmacy Consult for Heparin Indication: atrial fibrillation  Allergies  Allergen Reactions  . Mercury Other (See Comments)    unknown    Patient Measurements: Height: 5\' 5"  (165.1 cm) Weight: 123 lb (55.8 kg) IBW/kg (Calculated) : 57 HEPARIN DW (KG): 55.8  Vital Signs: Temp: 97.6 F (36.4 C) (01/10 2346) Temp Source: Oral (01/10 2346) BP: 102/56 (01/11 0100) Pulse Rate: 55 (01/11 0100)  Labs: Recent Labs    05/24/18 0853 05/24/18 1535 05/24/18 1638 05/24/18 2231 05/24/18 2350  HGB 12.4  --   --   --   --   HCT 40.5  --   --   --   --   PLT 77*  --   --   --   --   APTT  --  34  --   --   --   HEPARINUNFRC  --   --   --   --  0.65  CREATININE 0.79  --   --   --   --   TROPONINI 0.04*  --  0.63* 0.62*  --     Estimated Creatinine Clearance: 46.9 mL/min (by C-G formula based on SCr of 0.79 mg/dL).  Assessment: 85 y.o. F on heparin for afib.Heparin level therapeutic (0.65) on gtt at 700 units/hr.   Goal of Therapy:  Heparin level 0.3-0.7 units/ml Monitor platelets by anticoagulation protocol: Yes   Plan:  Continue heparin at 700 units/hr Will f/u confirmation level at 0800 Daily heparin level and CBC  Sherlon Handing, PharmD, BCPS Clinical pharmacist  **Pharmacist phone directory can now be found on [email or web address] ([email or web address]).  Listed under [place]. 05/25/2018,1:49 AM

## 2018-05-25 NOTE — Discharge Instructions (Signed)
Follow with Caryl Bis, MD in 5-7 days Follow up with Dr. Bronson Ing with Cardiology in 1-2 weeks  Please get a complete blood count and chemistry panel checked by your Primary MD at your next visit, and again as instructed by your Primary MD. Please get your medications reviewed and adjusted by your Primary MD.  Please request your Primary MD to go over all Hospital Tests and Procedure/Radiological results at the follow up, please get all Hospital records sent to your Prim MD by signing hospital release before you go home.  If you had Pneumonia of Lung problems at the Hospital: Please get a 2 view Chest X ray done in 6-8 weeks after hospital discharge or sooner if instructed by your Primary MD.  If you have Congestive Heart Failure: Please call your Cardiologist or Primary MD anytime you have any of the following symptoms:  1) 3 pound weight gain in 24 hours or 5 pounds in 1 week  2) shortness of breath, with or without a dry hacking cough  3) swelling in the hands, feet or stomach  4) if you have to sleep on extra pillows at night in order to breathe  Follow cardiac low salt diet and 1.5 lit/day fluid restriction.  If you have diabetes Accuchecks 4 times/day, Once in AM empty stomach and then before each meal. Log in all results and show them to your primary doctor at your next visit. If any glucose reading is under 80 or above 300 call your primary MD immediately.  If you have Seizure/Convulsions/Epilepsy: Please do not drive, operate heavy machinery, participate in activities at heights or participate in high speed sports until you have seen by Primary MD or a Neurologist and advised to do so again.  If you had Gastrointestinal Bleeding: Please ask your Primary MD to check a complete blood count within one week of discharge or at your next visit. Your endoscopic/colonoscopic biopsies that are pending at the time of discharge, will also need to followed by your Primary MD.  Get  Medicines reviewed and adjusted. Please take all your medications with you for your next visit with your Primary MD  Please request your Primary MD to go over all hospital tests and procedure/radiological results at the follow up, please ask your Primary MD to get all Hospital records sent to his/her office.  If you experience worsening of your admission symptoms, develop shortness of breath, life threatening emergency, suicidal or homicidal thoughts you must seek medical attention immediately by calling 911 or calling your MD immediately  if symptoms less severe.  You must read complete instructions/literature along with all the possible adverse reactions/side effects for all the Medicines you take and that have been prescribed to you. Take any new Medicines after you have completely understood and accpet all the possible adverse reactions/side effects.   Do not drive or operate heavy machinery when taking Pain medications.   Do not take more than prescribed Pain, Sleep and Anxiety Medications  Special Instructions: If you have smoked or chewed Tobacco  in the last 2 yrs please stop smoking, stop any regular Alcohol  and or any Recreational drug use.  Wear Seat belts while driving.  Please note You were cared for by a hospitalist during your hospital stay. If you have any questions about your discharge medications or the care you received while you were in the hospital after you are discharged, you can call the unit and asked to speak with the hospitalist on call if  the hospitalist that took care of you is not available. Once you are discharged, your primary care physician will handle any further medical issues. Please note that NO REFILLS for any discharge medications will be authorized once you are discharged, as it is imperative that you return to your primary care physician (or establish a relationship with a primary care physician if you do not have one) for your aftercare needs so that they  can reassess your need for medications and monitor your lab values.  You can reach the hospitalist office at phone [phone] or fax [phone]   If you do not have a primary care physician, you can call [phone] for a physician referral.  Activity: As tolerated with Full fall precautions use walker/cane & assistance as needed  Diet: regular  Disposition Home

## 2018-05-25 NOTE — Discharge Summary (Signed)
Physician Discharge Summary  Ree Shay DQQ:[id number] DOB: 10-25-1932 DOA: 05/24/2018  PCP: Caryl Bis, MD  Admit date: 05/24/2018 Discharge date: 05/25/2018  Admitted From: home Disposition:  home  Recommendations for Outpatient Follow-up:  1. Follow up with Dr. Bronson Ing in 1 to 2 weeks 2. Please obtain BMP/CBC in one week  Home Health: None Equipment/Devices: None  Discharge Condition: Stable CODE STATUS: DNR Diet recommendation: Regular  HPI: Per admitting MD, [name] is a 85 y.o. female without significant medical problems other than cataracts, hemorrhoids, IBS who presents to the hospital with chief complaint of palpitations.  Patient has been there when she woke up this morning she felt fluttering in her chest, also has a little bit of chest discomfort as well as weakness.  This is never happened to her before, and normally is quite active, able to do all her ADLs without any difficulties, ambulating without any assistive devices.  She denies any fever or chills.  She states she felt a little bit short of breath when palpitations were there but no longer short of breath currently.  She denies abdominal pain, nausea or vomiting.  She denies any flulike illness, fever chills or sick contacts.  Other than the palpitations this morning is otherwise at baseline.  She denies any personal history of heart disease, she denies any recent lower extremity swelling or dyspnea on exertion. ED Course: In the ED she was found to be in A. fib with RVR, blood work fairly unremarkable except for thrombocytopenia.  Cardiology was consulted, patient was placed on a Cardizem infusion and we are asked to admit  Hospital Course: Principal problem A. fib with RVR -patient was admitted to the hospital with palpitation and weakness, in the ED she was found to be in A. fib with RVR with rates into the 140s.  She was started on Cardizem infusion and cardiology was consulted and evaluated  patient.  Upon infusion of the Cardizem, she converted to sinus rhythm and has remained in sinus up until discharge.  Her Cardizem was solidified into a daily 120 mg, received that in the hospital and has remained stable.  She was initially placed on heparin infusion, there was no evidence of bleed, her hemoglobin has remained stable and she was transitioned to Eliquis which was dosed per pharmacy based on age as well as weight.  She has no history of GI bleed or any other bleeding issues. CHADS-VASc score is 3 for age, female.  A 2D echo was obtained which showed normal ejection fraction as seen in the full report below.  On the day of discharge she was able to ambulate in the hallway without chest pain, palpitations, or breathing difficulties.  She was advised to follow-up with cardiology as an outpatient.  Active Problems Thrombocytopenia -This is chronic, patient is aware of this and has been told by PCP that this is been going on for a while and has remained stable over the years.  Platelets stable, no bleeding IBS -Resume home medications Troponin elevation -Likely demand ischemia in the setting of A. fib with RVR.  History of hemorrhoids -patient occasionally sees blood when wiping, no frank bleeding, prescribed MiraLAX on discharge  Discharge Diagnoses:  Active Problems:   Atrial fibrillation with RVR [facility])  Discharge Instructions   Allergies as of 05/25/2018      Reactions   Mercury Other (See Comments)   unknown      Medication List    TAKE these medications  ALPRAZolam 0.25 MG tablet Commonly known as:  XANAX Take 0.125 mg by mouth daily as needed for anxiety or sleep.   apixaban 2.5 MG Tabs tablet Commonly known as:  ELIQUIS Take 1 tablet (2.5 mg total) by mouth 2 (two) times daily.   BIOTIN PO Take 1 tablet by mouth 2 (two) times daily.   CALTRATE COLON HEALTH PO Take 2 tablets by mouth daily.   carboxymethylcellulose 0.5 % Soln Commonly known as:  REFRESH PLUS 1  drop 2 (two) times daily as needed.   Cholecalciferol 25 MCG (1000 UT) capsule Take 1,000 Units by mouth daily.   citalopram 20 MG tablet Commonly known as:  CELEXA Take 10 mg by mouth daily.   CULTURELLE DIGESTIVE HEALTH PO Take 1 tablet by mouth daily.   diltiazem 120 MG 24 hr capsule Commonly known as:  CARDIZEM CD Take 1 capsule (120 mg total) by mouth daily. Start taking on:  May 26, 2018   hydrocortisone 2.5 % rectal cream Commonly known as:  ANUSOL-HC Place 1 application rectally 2 (two) times daily as needed for hemorrhoids.   hyoscyamine 0.125 MG SL tablet Commonly known as:  LEVSIN SL PLACE 1 TABLET UNDER TONGUE THREE TIMES DAILY AS NEEDED What changed:  See the new instructions.   loratadine-pseudoephedrine 10-240 MG 24 hr tablet Commonly known as:  CLARITIN-D 24-hour Take 1 tablet by mouth daily.   polyethylene glycol packet Commonly known as:  MIRALAX Take 17 g by mouth daily.   SF 5000 PLUS 1.1 % Crea dental cream Generic drug:  sodium fluoride USE IN PLACE OF REGULAR TOOTHPASTE, BRUSH BEFORE BEDTIME FOR TWO MINUTES, THEN EXPECTORATE. NO RINSING, EATING OR DRINKING FOR 30 MINUTES.      Follow-up Information    Herminio Commons, MD. Schedule an appointment as soon as possible for a visit in 2 week(s).   Specialty:  Cardiology Contact information: [place] [address] [phone]           Consultations:  Cardiology  Procedures/Studies:  2D echo  Study Conclusions - Left ventricle: The cavity size was normal. Wall thickness was normal. Systolic function was normal. The estimated ejection fraction was in the range of 60% to 65%. Wall motion was normal; there were no regional wall motion abnormalities. Doppler parameters are consistent with abnormal left ventricular relaxation (grade 1 diastolic dysfunction). - Mitral valve: There was mild regurgitation. - Pulmonary arteries: PA peak pressure: 44 mm Hg (S). -  Impressions: The LV outflow trract is very narrow but no significant LVOT obstruction. No evidence of SAM.  Impressions: - The LV outflow trract is very narrow but no significant LVOT obstruction. No evidence of SAM.  Dg Chest Port 1 View  Result Date: 05/24/2018 CLINICAL DATA:  Cardiac palpitations EXAM: PORTABLE CHEST 1 VIEW COMPARISON:  None. FINDINGS: There is no edema or consolidation. There is cardiomegaly with pulmonary vascularity normal. No adenopathy. There is aortic atherosclerosis. No bone lesions. IMPRESSION: Cardiomegaly. Aortic atherosclerosis.  No edema or consolidation. Aortic Atherosclerosis (ICD10-I70.0). Electronically Signed   By: Lowella Grip III M.D.   On: 05/24/2018 09:04     Subjective: - no chest pain, shortness of breath, no abdominal pain, nausea or vomiting.    Discharge Exam: Vitals:   05/25/18 1500 05/25/18 1600  BP: 130/65 (!) 147/66  Pulse: (!) 58 (!) 59  Resp: (!) 21 17  Temp:    SpO2: 97% 97%    General: Pt is alert, awake, not in acute distress  Cardiovascular: RRR, S1/S2 +, no rubs, no gallops Respiratory: CTA bilaterally, no wheezing, no rhonchi Abdominal: Soft, NT, ND, bowel sounds + Extremities: no edema, no cyanosis    The results of significant diagnostics from this hospitalization (including imaging, microbiology, ancillary and laboratory) are listed below for reference.     Microbiology: Recent Results (from the past 240 hour(s))  MRSA PCR Screening     Status: None   Collection Time: 05/24/18  4:31 PM  Result Value Ref Range Status   MRSA by PCR NEGATIVE NEGATIVE Final    Comment:        The GeneXpert MRSA Assay (FDA approved for NASAL specimens only), is one component of a comprehensive MRSA colonization surveillance program. It is not intended to diagnose MRSA infection nor to guide or monitor treatment for MRSA infections. Performed at [facility], [address] [place], [place] [address]      Labs: BNP  (last 3 results) No results for input(s): BNP in the last 8760 hours. Basic Metabolic Panel: Recent Labs  Lab 05/24/18 0853 05/25/18 0456  NA 139 138  K 3.8 3.6  CL 107 108  CO2 27 25  GLUCOSE 110* 98  BUN 14 14  CREATININE 0.79 0.75  CALCIUM 9.1 8.9  MG 2.0  --    Liver Function Tests: Recent Labs  Lab 05/24/18 0853  AST 19  ALT 16  ALKPHOS 59  BILITOT 0.7  PROT 6.6  ALBUMIN 3.6   No results for input(s): LIPASE, AMYLASE in the last 168 hours. No results for input(s): AMMONIA in the last 168 hours. CBC: Recent Labs  Lab 05/24/18 0853 05/25/18 0456  WBC 4.4 5.9  NEUTROABS 3.0  --   HGB 12.4 12.0  HCT 40.5 39.3  MCV 95.1 95.4  PLT 77* 89*   Cardiac Enzymes: Recent Labs  Lab 05/24/18 0853 05/24/18 1638 05/24/18 2231 05/25/18 0456  TROPONINI 0.04* 0.63* 0.62* 0.49*   BNP: Invalid input(s): POCBNP CBG: No results for input(s): GLUCAP in the last 168 hours. D-Dimer No results for input(s): DDIMER in the last 72 hours. Hgb A1c No results for input(s): HGBA1C in the last 72 hours. Lipid Profile No results for input(s): CHOL, HDL, LDLCALC, TRIG, CHOLHDL, LDLDIRECT in the last 72 hours. Thyroid function studies Recent Labs    05/24/18 0853  TSH 4.351   Anemia work up No results for input(s): VITAMINB12, FOLATE, FERRITIN, TIBC, IRON, RETICCTPCT in the last 72 hours. Urinalysis    Component Value Date/Time   COLORURINE COLORLESS (A) 05/24/2018 0843   APPEARANCEUR CLEAR 05/24/2018 0843   LABSPEC 1.003 (L) 05/24/2018 0843   PHURINE 8.0 05/24/2018 0843   GLUCOSEU NEGATIVE 05/24/2018 0843   HGBUR SMALL (A) 05/24/2018 0843   BILIRUBINUR NEGATIVE 05/24/2018 0843   KETONESUR NEGATIVE 05/24/2018 0843   PROTEINUR NEGATIVE 05/24/2018 0843   NITRITE NEGATIVE 05/24/2018 0843   LEUKOCYTESUR NEGATIVE 05/24/2018 0843   Sepsis Labs Invalid input(s): PROCALCITONIN,  WBC,  LACTICIDVEN   Time coordinating discharge: 35 minutes  SIGNED:  Marzetta Board,  MD  [facility] 05/25/2018, 4:42 PM

## 2018-05-25 NOTE — Progress Notes (Signed)
ANTICOAGULATION CONSULT NOTE - Follow-up Consult  Pharmacy Consult for Heparin--> eliquis Indication: atrial fibrillation  Allergies  Allergen Reactions  . Mercury Other (See Comments)    unknown    Patient Measurements: Height: 5\' 5"  (165.1 cm) Weight: 122 lb 5.7 oz (55.5 kg) IBW/kg (Calculated) : 57 HEPARIN DW (KG): 55.8  Vital Signs: Temp: 97.8 F (36.6 C) (01/11 0348) Temp Source: Oral (01/11 0829) BP: 127/53 (01/11 0900) Pulse Rate: 77 (01/11 0900)  Labs: Recent Labs    05/24/18 0853 05/24/18 1535 05/24/18 1638 05/24/18 2231 05/24/18 2350 05/25/18 0456 05/25/18 0751  HGB 12.4  --   --   --   --  12.0  --   HCT 40.5  --   --   --   --  39.3  --   PLT 77*  --   --   --   --  89*  --   APTT  --  34  --   --   --   --   --   HEPARINUNFRC  --   --   --   --  0.65  --  0.67  CREATININE 0.79  --   --   --   --  0.75  --   TROPONINI 0.04*  --  0.63* 0.62*  --  0.49*  --     Estimated Creatinine Clearance: 46.7 mL/min (by C-G formula based on SCr of 0.75 mg/dL).  Assessment: 85 y.o. F on heparin for afib.Heparin level remains therapeutic.  Plan now to transition to eliquis  Goal of Therapy:  Monitor platelets by anticoagulation protocol: Yes   Plan:  D/c heparin, in 1 hour start eliquis 2.5mg  po bid (85 yo and wt 55.5kg) Continue to monitor H&H and platelets Educate on eliquis  Isac Sarna, BS Pharm D, BCPS Clinical Pharmacist Pager [phone] 05/25/2018,9:39 AM

## 2018-06-18 ENCOUNTER — Encounter: Payer: Self-pay | Admitting: Cardiovascular Disease

## 2018-06-18 ENCOUNTER — Ambulatory Visit (INDEPENDENT_AMBULATORY_CARE_PROVIDER_SITE_OTHER): Payer: Medicare Other | Admitting: Cardiovascular Disease

## 2018-06-18 VITALS — BP 142/52 | HR 65 | Ht 64.5 in | Wt 129.0 lb

## 2018-06-18 DIAGNOSIS — I480 Paroxysmal atrial fibrillation: Secondary | ICD-10-CM

## 2018-06-18 DIAGNOSIS — Z01818 Encounter for other preprocedural examination: Secondary | ICD-10-CM | POA: Diagnosis not present

## 2018-06-18 NOTE — Patient Instructions (Signed)
Medication Instructions:  Your physician recommends that you continue on your current medications as directed. Please refer to the Current Medication list given to you today.  If you need a refill on your cardiac medications before your next appointment, please call your pharmacy.   Lab work: NONE  If you have labs (blood work) drawn today and your tests are completely normal, you will receive your results only by: . MyChart Message (if you have MyChart) OR . A paper copy in the mail If you have any lab test that is abnormal or we need to change your treatment, we will call you to review the results.  Testing/Procedures: NONE   Follow-Up: At [facility], you and your health needs are our priority.  As part of our continuing mission to provide you with exceptional heart care, we have created designated Provider Care Teams.  These Care Teams include your primary Cardiologist (physician) and Advanced Practice Providers (APPs -  Physician Assistants and Nurse Practitioners) who all work together to provide you with the care you need, when you need it. You will need a follow up appointment in 4 months.  Please call our office 2 months in advance to schedule this appointment.  You may see Suresh Koneswaran, MD or one of the following Advanced Practice Providers on your designated Care Team:   Brittany Strader, PA-C ([place] Office) . Michele Lenze, PA-C ([place] Office)  Any Other Special Instructions Will Be Listed Below (If Applicable). Thank you for choosing [facility]!     

## 2018-06-18 NOTE — Progress Notes (Signed)
SUBJECTIVE: The patient presents for posthospitalization follow-up.  I saw her in hospital consultation when she was initially diagnosed with new onset rapid atrial fibrillation on 05/24/2018.  Troponins were elevated which appear to be due to demand ischemia.  She has chronic thrombocytopenia.  Echocardiogram demonstrated normal left ventricular systolic function and regional wall motion, LVEF 60 to 65%.  There was grade 1 diastolic dysfunction and mild mitral vegetation.  The left ventricular outflow tract was very narrow but there was no significant LVOT obstruction.  She was initially placed on IV heparin which was transitioned to low-dose Eliquis 2.5 mg twice daily.  She is scheduled to undergo eye surgery/ectropion repair in the near future.  She is here with her husband.  He had a heart attack and sees Dr. Angelena Form.  She denies palpitations, leg swelling, orthopnea, dizziness, bleeding problems, and shortness of breath.  She seldom has fleeting chest pains lasting a second.  They are nonexertional.  She likes to walk with her husband.  She checks her blood pressure routinely at home.  I reviewed the blood pressure log which shows blood pressures in the 130/60-70 range.  She had several questions regarding her disease process and medications which we discussed at length.   Review of Systems: As per "subjective", otherwise negative.  Allergies  Allergen Reactions  . Mercury Other (See Comments)    unknown    Current Outpatient Medications  Medication Sig Dispense Refill  . ALPRAZolam (XANAX) 0.25 MG tablet Take 0.125 mg by mouth daily as needed for anxiety or sleep.     Marland Kitchen apixaban (ELIQUIS) 2.5 MG TABS tablet Take 1 tablet (2.5 mg total) by mouth 2 (two) times daily. 60 tablet 1  . BIOTIN PO Take 1 tablet by mouth 2 (two) times daily.    . Calcium Carbonate-Vitamin D (CALTRATE COLON HEALTH PO) Take 2 tablets by mouth daily.     . carboxymethylcellulose (REFRESH PLUS)  0.5 % SOLN 1 drop 2 (two) times daily as needed.    . Cholecalciferol 1000 units capsule Take 1,000 Units by mouth daily.     . citalopram (CELEXA) 20 MG tablet Take 10 mg by mouth daily.     Marland Kitchen diltiazem (CARDIZEM CD) 120 MG 24 hr capsule Take 1 capsule (120 mg total) by mouth daily. 30 capsule 1  . hydrocortisone (ANUSOL-HC) 2.5 % rectal cream Place 1 application rectally 2 (two) times daily as needed for hemorrhoids.     . hyoscyamine (LEVSIN SL) 0.125 MG SL tablet PLACE 1 TABLET UNDER TONGUE THREE TIMES DAILY AS NEEDED (Patient taking differently: PLACE 1 TABLET UNDER TONGUE THREE TIMES DAILY AS NEEDED FOR IBS) 90 tablet 2  . Lactobacillus-Inulin (CULTURELLE DIGESTIVE HEALTH PO) Take 1 tablet by mouth daily.    Marland Kitchen loratadine-pseudoephedrine (CLARITIN-D 24-HOUR) 10-240 MG 24 hr tablet Take 1 tablet by mouth daily.    . polyethylene glycol (MIRALAX) packet Take 17 g by mouth daily. 30 packet 1  . SF 5000 PLUS 1.1 % CREA dental cream USE IN PLACE OF REGULAR TOOTHPASTE, BRUSH BEFORE BEDTIME FOR TWO MINUTES, THEN EXPECTORATE. NO RINSING, EATING OR DRINKING FOR 30 MINUTES.  99   No current facility-administered medications for this visit.     Past Medical History:  Diagnosis Date  . IBS (irritable bowel syndrome)   . PONV (postoperative nausea and vomiting)     Past Surgical History:  Procedure Laterality Date  . CATARACT EXTRACTION W/PHACO Left 02/02/2017   Procedure: CATARACT EXTRACTION PHACO  AND INTRAOCULAR LENS PLACEMENT (IOC);  Surgeon: Baruch Goldmann, MD;  Location: [facility]S;  Service: Ophthalmology;  Laterality: Left;  CDE: 14.49  . CATARACT EXTRACTION W/PHACO Right 03/02/2017   Procedure: CATARACT EXTRACTION PHACO AND INTRAOCULAR LENS PLACEMENT ([place]);  Surgeon: Baruch Goldmann, MD;  Location: [facility] ORS;  Service: Ophthalmology;  Laterality: Right;  CDE: 12.29  . COLONOSCOPY N/A 07/24/2014   Procedure: COLONOSCOPY;  Surgeon: Rogene Houston, MD;  Location: [facility] ENDO SUITE;  Service: Endoscopy;   Laterality: N/A;  900  . HEMORRHOID SURGERY    . TONSILLECTOMY      Social History   Socioeconomic History  . Marital status: Married    Spouse name: Not on file  . Number of children: Not on file  . Years of education: Not on file  . Highest education level: Not on file  Occupational History  . Not on file  Social Needs  . Financial resource strain: Not on file  . Food insecurity:    Worry: Not on file    Inability: Not on file  . Transportation needs:    Medical: Not on file    Non-medical: Not on file  Tobacco Use  . Smoking status: Never Smoker  . Smokeless tobacco: Never Used  Substance and Sexual Activity  . Alcohol use: No    Alcohol/week: 0.0 standard drinks  . Drug use: No  . Sexual activity: Not on file  Lifestyle  . Physical activity:    Days per week: Not on file    Minutes per session: Not on file  . Stress: Not on file  Relationships  . Social connections:    Talks on phone: Not on file    Gets together: Not on file    Attends religious service: Not on file    Active member of club or organization: Not on file    Attends meetings of clubs or organizations: Not on file    Relationship status: Not on file  . Intimate partner violence:    Fear of current or ex partner: Not on file    Emotionally abused: Not on file    Physically abused: Not on file    Forced sexual activity: Not on file  Other Topics Concern  . Not on file  Social History Narrative  . Not on file     Vitals:   06/18/18 1330  BP: (!) 142/52  Pulse: 65  SpO2: 96%  Weight: 129 lb (58.5 kg)  Height: 5' 4.5" (1.638 m)    Wt Readings from Last 3 Encounters:  06/18/18 129 lb (58.5 kg)  05/25/18 122 lb 5.7 oz (55.5 kg)  01/12/17 127 lb (57.6 kg)     PHYSICAL EXAM General: NAD HEENT: Normal. Neck: No JVD, no thyromegaly. Lungs: Clear to auscultation bilaterally with normal respiratory effort. CV: Regular rate and rhythm, normal S1/S2, no S3/S4, no murmur. No pretibial or  periankle edema.  No carotid bruit.   Abdomen: Soft, nontender, no distention.  Neurologic: Alert and oriented.  Psych: Normal affect. Skin: Normal. Musculoskeletal: No gross deformities.    ECG: Reviewed above under Subjective   Labs: Lab Results  Component Value Date/Time   K 3.6 05/25/2018 04:56 AM   BUN 14 05/25/2018 04:56 AM   CREATININE 0.75 05/25/2018 04:56 AM   ALT 16 05/24/2018 08:53 AM   TSH 4.351 05/24/2018 08:53 AM   HGB 12.0 05/25/2018 04:56 AM     Lipids: No results found for: LDLCALC, LDLDIRECT, CHOL, TRIG, HDL  ASSESSMENT AND PLAN: 1.  Paroxysmal atrial fibrillation: Symptomatically stable.  Continue long-acting diltiazem 120 mg daily.  Continue low-dose Eliquis 2.5 mg twice daily for systemic anticoagulation.  2.  Preoperative risk stratification: She is at low risk for a cardiac event in the perioperative period.  I recommend she not miss a dose of Eliquis for a minimum of a month after converting to sinus rhythm. I told the patient she could hold Eliquis for 2 days prior to ectropion repair if the surgery were to be performed at the end of February or March.    Disposition: Follow up 4 months   Kate Sable, M.D., F.A.C.C.

## 2018-10-02 ENCOUNTER — Telehealth: Payer: Self-pay | Admitting: *Deleted

## 2018-10-02 NOTE — Telephone Encounter (Signed)
Patient verbally consented for tele-health visits with [facility] and understands that her insurance company will be billed for the encounter.  Aware to have vitals available   

## 2018-10-08 ENCOUNTER — Other Ambulatory Visit: Payer: Self-pay

## 2018-10-08 ENCOUNTER — Encounter: Payer: Self-pay | Admitting: Cardiovascular Disease

## 2018-10-08 ENCOUNTER — Telehealth (INDEPENDENT_AMBULATORY_CARE_PROVIDER_SITE_OTHER): Payer: Medicare Other | Admitting: Cardiovascular Disease

## 2018-10-08 VITALS — BP 134/63 | HR 69 | Ht 64.0 in | Wt 123.0 lb

## 2018-10-08 DIAGNOSIS — I480 Paroxysmal atrial fibrillation: Secondary | ICD-10-CM | POA: Diagnosis not present

## 2018-10-08 DIAGNOSIS — D696 Thrombocytopenia, unspecified: Secondary | ICD-10-CM

## 2018-10-08 NOTE — Progress Notes (Signed)
Virtual Visit via Telephone Note   This visit type was conducted due to national recommendations for restrictions regarding the COVID-19 Pandemic (e.g. social distancing) in an effort to limit this patient's exposure and mitigate transmission in our community.  Due to her co-morbid illnesses, this patient is at least at moderate risk for complications without adequate follow up.  This format is felt to be most appropriate for this patient at this time.  The patient did not have access to video technology/had technical difficulties with video requiring transitioning to audio format only (telephone).  All issues noted in this document were discussed and addressed.  No physical exam could be performed with this format.  Please refer to the patient's chart for her  consent to telehealth for [facility].   Date:  10/08/2018   ID:  [name], DOB 1933/04/01, MRN [id number]  Patient Location: Home Provider Location: Office  PCP:  Caryl Bis, MD  Cardiologist:  Kate Sable, MD  Electrophysiologist:  None   Evaluation Performed:  Follow-Up Visit  Chief Complaint: Atrial fibrillation  History of Present Illness:    [name] is a 85 y.o. female with a history of paroxysmal atrial fibrillation.  The patient denies any symptoms of chest pain, palpitations, shortness of breath, lightheadedness, dizziness, leg swelling, orthopnea, PND, and syncope.  She denies bleeding problems with Eliquis.  She hasn't fully regained her strength.  The patient does not have symptoms concerning for COVID-19 infection (fever, chills, cough, or new shortness of breath).    Past Medical History:  Diagnosis Date  . IBS (irritable bowel syndrome)   . PONV (postoperative nausea and vomiting)    Past Surgical History:  Procedure Laterality Date  . CATARACT EXTRACTION W/PHACO Left 02/02/2017   Procedure: CATARACT EXTRACTION PHACO AND INTRAOCULAR LENS PLACEMENT (IOC);  Surgeon: Baruch Goldmann, MD;  Location: [facility];  Service: Ophthalmology;  Laterality: Left;  CDE: 14.49  . CATARACT EXTRACTION W/PHACO Right 03/02/2017   Procedure: CATARACT EXTRACTION PHACO AND INTRAOCULAR LENS PLACEMENT ([place]);  Surgeon: Baruch Goldmann, MD;  Location: [facility];  Service: Ophthalmology;  Laterality: Right;  CDE: 12.29  . COLONOSCOPY N/A 07/24/2014   Procedure: COLONOSCOPY;  Surgeon: Rogene Houston, MD;  Location: [facility];  Service: Endoscopy;  Laterality: N/A;  900  . HEMORRHOID SURGERY    . TONSILLECTOMY       Current Meds  Medication Sig  . ALPRAZolam (XANAX) 0.25 MG tablet Take 0.125 mg by mouth daily as needed for anxiety or sleep.   Marland Kitchen apixaban (ELIQUIS) 2.5 MG TABS tablet Take 1 tablet (2.5 mg total) by mouth 2 (two) times daily.  . Calcium Carbonate-Vitamin D (CALTRATE COLON HEALTH PO) Take 2 tablets by mouth daily.   . Cholecalciferol 1000 units capsule Take 1,000 Units by mouth daily.   . citalopram (CELEXA) 20 MG tablet Take 20 mg by mouth daily.   Marland Kitchen diltiazem (CARDIZEM CD) 120 MG 24 hr capsule Take 1 capsule (120 mg total) by mouth daily.  . hydrocortisone (ANUSOL-HC) 2.5 % rectal cream Place 1 application rectally 2 (two) times daily as needed for hemorrhoids.   . hyoscyamine (LEVSIN SL) 0.125 MG SL tablet PLACE 1 TABLET UNDER TONGUE THREE TIMES DAILY AS NEEDED (Patient taking differently: PLACE 1 TABLET UNDER TONGUE THREE TIMES DAILY AS NEEDED FOR IBS)  . Lactobacillus-Inulin (CULTURELLE DIGESTIVE HEALTH PO) Take 1 tablet by mouth daily.  Marland Kitchen loratadine-pseudoephedrine (CLARITIN-D 24-HOUR) 10-240 MG 24 hr tablet Take 1 tablet by  mouth daily.  Marland Kitchen MISC NATURAL PRODUCTS PO Take 1 tablet by mouth daily. Hair Volume  . polyethylene glycol (MIRALAX) packet Take 17 g by mouth daily.  . SF 5000 PLUS 1.1 % CREA dental cream USE IN PLACE OF REGULAR TOOTHPASTE, BRUSH BEFORE BEDTIME FOR TWO MINUTES, THEN EXPECTORATE. NO RINSING, EATING OR DRINKING FOR 30 MINUTES.     Allergies:   Mercury    Social History   Tobacco Use  . Smoking status: Never Smoker  . Smokeless tobacco: Never Used  Substance Use Topics  . Alcohol use: No    Alcohol/week: 0.0 standard drinks  . Drug use: No     Family Hx: The patient's family history is not on file.  ROS:   Please see the history of present illness.     All other systems reviewed and are negative.   Prior CV studies:   The following studies were reviewed today:  Echocardiogram 05/25/2018:  - Left ventricle: The cavity size was normal. Wall thickness was   normal. Systolic function was normal. The estimated ejection   fraction was in the range of 60% to 65%. Wall motion was normal;   there were no regional wall motion abnormalities. Doppler   parameters are consistent with abnormal left ventricular   relaxation (grade 1 diastolic dysfunction). - Mitral valve: There was mild regurgitation. - Pulmonary arteries: PA peak pressure: 44 mm Hg (S). - Impressions: The LV outflow trract is very narrow but no   significant LVOT obstruction. No evidence of SAM.  Impressions:  - The LV outflow trract is very narrow but no significant LVOT   obstruction. No evidence of SAM.  Labs/Other Tests and Data Reviewed:    EKG:  No ECG reviewed.  Recent Labs: 05/24/2018: ALT 16; Magnesium 2.0; TSH 4.351 05/25/2018: BUN 14; Creatinine, Ser 0.75; Hemoglobin 12.0; Platelets 89; Potassium 3.6; Sodium 138   Recent Lipid Panel No results found for: CHOL, TRIG, HDL, CHOLHDL, LDLCALC, LDLDIRECT  Wt Readings from Last 3 Encounters:  10/08/18 123 lb (55.8 kg)  06/18/18 129 lb (58.5 kg)  05/25/18 122 lb 5.7 oz (55.5 kg)     Objective:    Vital Signs:  BP 134/63   Pulse 69   Ht 5\' 4"  (1.626 m)   Wt 123 lb (55.8 kg)   BMI 21.11 kg/m    VITAL SIGNS:  reviewed  ASSESSMENT & PLAN:    1.  Paroxysmal atrial fibrillation: Symptomatically stable.  Continue long-acting diltiazem 120 mg daily.  Continue low-dose Eliquis 2.5 mg twice daily for  systemic anticoagulation.  Platelets 81,000 on 09/13/2018.  2.  Thrombocytopenia: This is chronic.  Platelets were 81,000 on 09/13/2018.    COVID-19 Education: The signs and symptoms of COVID-19 were discussed with the patient and how to seek care for testing (follow up with PCP or arrange E-visit).  The importance of social distancing was discussed today.  Time:   Today, I have spent 15 minutes with the patient with telehealth technology discussing the above problems.     Medication Adjustments/Labs and Tests Ordered: Current medicines are reviewed at length with the patient today.  Concerns regarding medicines are outlined above.   Tests Ordered: No orders of the defined types were placed in this encounter.   Medication Changes: No orders of the defined types were placed in this encounter.   Disposition:  Follow up in 6 month(s)  Signed, Kate Sable, MD  10/08/2018 11:41 AM    [place]

## 2018-10-08 NOTE — Patient Instructions (Signed)

## 2018-10-30 ENCOUNTER — Observation Stay (HOSPITAL_COMMUNITY)
Admission: EM | Admit: 2018-10-30 | Discharge: 2018-10-31 | Disposition: A | Discharge status: Home / Self Care | Payer: Medicare Other | Attending: Internal Medicine | Admitting: Internal Medicine

## 2018-10-30 ENCOUNTER — Encounter (HOSPITAL_COMMUNITY): Payer: Self-pay

## 2018-10-30 ENCOUNTER — Emergency Department (HOSPITAL_COMMUNITY): Payer: Medicare Other

## 2018-10-30 ENCOUNTER — Other Ambulatory Visit: Payer: Self-pay

## 2018-10-30 DIAGNOSIS — R072 Precordial pain: Secondary | ICD-10-CM | POA: Diagnosis not present

## 2018-10-30 DIAGNOSIS — K580 Irritable bowel syndrome with diarrhea: Secondary | ICD-10-CM | POA: Insufficient documentation

## 2018-10-30 DIAGNOSIS — Z7901 Long term (current) use of anticoagulants: Secondary | ICD-10-CM | POA: Diagnosis not present

## 2018-10-30 DIAGNOSIS — R7989 Other specified abnormal findings of blood chemistry: Secondary | ICD-10-CM | POA: Insufficient documentation

## 2018-10-30 DIAGNOSIS — Z79899 Other long term (current) drug therapy: Secondary | ICD-10-CM | POA: Diagnosis not present

## 2018-10-30 DIAGNOSIS — Z20828 Contact with and (suspected) exposure to other viral communicable diseases: Secondary | ICD-10-CM | POA: Diagnosis not present

## 2018-10-30 DIAGNOSIS — R9431 Abnormal electrocardiogram [ECG] [EKG]: Secondary | ICD-10-CM | POA: Insufficient documentation

## 2018-10-30 DIAGNOSIS — I480 Paroxysmal atrial fibrillation: Secondary | ICD-10-CM | POA: Diagnosis not present

## 2018-10-30 DIAGNOSIS — D696 Thrombocytopenia, unspecified: Secondary | ICD-10-CM | POA: Insufficient documentation

## 2018-10-30 DIAGNOSIS — R778 Other specified abnormalities of plasma proteins: Secondary | ICD-10-CM

## 2018-10-30 DIAGNOSIS — R079 Chest pain, unspecified: Secondary | ICD-10-CM | POA: Diagnosis present

## 2018-10-30 DIAGNOSIS — R002 Palpitations: Secondary | ICD-10-CM

## 2018-10-30 DIAGNOSIS — R0789 Other chest pain: Secondary | ICD-10-CM

## 2018-10-30 DIAGNOSIS — R000 Tachycardia, unspecified: Secondary | ICD-10-CM | POA: Diagnosis present

## 2018-10-30 HISTORY — DX: Unspecified atrial fibrillation: I48.91

## 2018-10-30 HISTORY — DX: Essential (primary) hypertension: I10

## 2018-10-30 LAB — TSH: TSH: 3.174 u[IU]/mL (ref 0.350–4.500)

## 2018-10-30 LAB — CBC
HCT: 42.7 % (ref 36.0–46.0)
Hemoglobin: 13.5 g/dL (ref 12.0–15.0)
MCH: 30 pg (ref 26.0–34.0)
MCHC: 31.6 g/dL (ref 30.0–36.0)
MCV: 94.9 fL (ref 80.0–100.0)
Platelets: 77 10*3/uL — ABNORMAL LOW (ref 150–400)
RBC: 4.5 MIL/uL (ref 3.87–5.11)
RDW: 13.2 % (ref 11.5–15.5)
WBC: 5.6 10*3/uL (ref 4.0–10.5)
nRBC: 0 % (ref 0.0–0.2)

## 2018-10-30 LAB — TROPONIN I
Troponin I: <0.03 ng/mL (ref <0.03)
Troponin I: 0.04 ng/mL (ref <0.03)
Troponin I: 0.06 ng/mL (ref <0.03)
Troponin I: 0.06 ng/mL (ref <0.03)

## 2018-10-30 LAB — BASIC METABOLIC PANEL
Anion gap: 11 (ref 5–15)
BUN: 15 mg/dL (ref 8–23)
CO2: 26 mmol/L (ref 22–32)
Calcium: 9.5 mg/dL (ref 8.9–10.3)
Chloride: 102 mmol/L (ref 98–111)
Creatinine, Ser: 0.95 mg/dL (ref 0.44–1.00)
GFR calc Af Amer: >60 mL/min (ref >60)
GFR calc non Af Amer: 55 mL/min — ABNORMAL LOW (ref >60)
Glucose, Bld: 106 mg/dL — ABNORMAL HIGH (ref 70–99)
Potassium: 4.1 mmol/L (ref 3.5–5.1)
Sodium: 139 mmol/L (ref 135–145)

## 2018-10-30 LAB — VITAMIN B12: Vitamin B-12: 75 pg/mL — ABNORMAL LOW (ref 180–914)

## 2018-10-30 LAB — FOLATE: Folate: 9.7 ng/mL (ref >5.9)

## 2018-10-30 LAB — T4, FREE: Free T4: 1.16 ng/dL — ABNORMAL HIGH (ref 0.61–1.12)

## 2018-10-30 LAB — SARS CORONAVIRUS 2 BY RT PCR (HOSPITAL ORDER, PERFORMED IN ~~LOC~~ HOSPITAL LAB): SARS Coronavirus 2: NEGATIVE

## 2018-10-30 MED ORDER — ACETAMINOPHEN 650 MG RE SUPP: 650.0000 mg | Freq: Four times a day (QID) | RECTAL | Status: DC | PRN

## 2018-10-30 MED ORDER — DILTIAZEM HCL ER COATED BEADS 180 MG PO CP24
180.0000 mg | ORAL_CAPSULE | Freq: Every day | ORAL | Status: DC
  Administered 2018-10-31: 11:00:00 180 mg via ORAL
  Filled 2018-10-30 (×2): qty 1

## 2018-10-30 MED ORDER — VITAMIN D 25 MCG (1000 UNIT) PO TABS
1000.0000 [IU] | ORAL_TABLET | Freq: Every day | ORAL | Status: DC
  Administered 2018-10-30 – 2018-10-31 (×2): 1000 [IU] via ORAL
  Filled 2018-10-30 (×2): qty 1

## 2018-10-30 MED ORDER — LORATADINE 10 MG PO TABS
10.0000 mg | ORAL_TABLET | Freq: Every day | ORAL | Status: DC
  Administered 2018-10-31: 11:00:00 10 mg via ORAL
  Filled 2018-10-30 (×2): qty 1

## 2018-10-30 MED ORDER — SIMETHICONE 80 MG PO CHEW: 80.0000 mg | CHEWABLE_TABLET | Freq: Four times a day (QID) | ORAL | Status: DC | PRN

## 2018-10-30 MED ORDER — ASPIRIN EC 81 MG PO TBEC
81.0000 mg | DELAYED_RELEASE_TABLET | Freq: Every day | ORAL | Status: DC
  Administered 2018-10-31: 81 mg via ORAL
  Filled 2018-10-30 (×2): qty 1

## 2018-10-30 MED ORDER — HYDROCORTISONE 2.5 % RE CREA: 1.0000 "application " | TOPICAL_CREAM | Freq: Two times a day (BID) | RECTAL | Status: DC | PRN

## 2018-10-30 MED ORDER — ALPRAZOLAM 0.25 MG PO TABS: 0.1250 mg | ORAL_TABLET | Freq: Every day | ORAL | Status: DC | PRN

## 2018-10-30 MED ORDER — CITALOPRAM HYDROBROMIDE 20 MG PO TABS
20.0000 mg | ORAL_TABLET | Freq: Every day | ORAL | Status: DC
  Administered 2018-10-31: 11:00:00 20 mg via ORAL
  Filled 2018-10-30 (×2): qty 1

## 2018-10-30 MED ORDER — POLYVINYL ALCOHOL 1.4 % OP SOLN: 1.0000 [drp] | Freq: Two times a day (BID) | OPHTHALMIC | Status: DC | PRN

## 2018-10-30 MED ORDER — POLYVINYL ALCOHOL 1.4 % OP SOLN
1.0000 [drp] | Freq: Two times a day (BID) | OPHTHALMIC | Status: DC
  Administered 2018-10-30 – 2018-10-31 (×2): 1 [drp] via OPHTHALMIC
  Filled 2018-10-30: qty 15

## 2018-10-30 MED ORDER — ONDANSETRON HCL 4 MG PO TABS: 4.0000 mg | ORAL_TABLET | Freq: Four times a day (QID) | ORAL | Status: DC | PRN

## 2018-10-30 MED ORDER — ACETAMINOPHEN 325 MG PO TABS: 650.0000 mg | ORAL_TABLET | Freq: Four times a day (QID) | ORAL | Status: DC | PRN

## 2018-10-30 MED ORDER — HYOSCYAMINE SULFATE 0.125 MG SL SUBL: 0.1250 mg | SUBLINGUAL_TABLET | Freq: Four times a day (QID) | SUBLINGUAL | Status: DC | PRN

## 2018-10-30 MED ORDER — ONDANSETRON HCL 4 MG/2ML IJ SOLN: 4.0000 mg | Freq: Four times a day (QID) | INTRAMUSCULAR | Status: DC | PRN

## 2018-10-30 MED ORDER — APIXABAN 2.5 MG PO TABS
2.5000 mg | ORAL_TABLET | Freq: Two times a day (BID) | ORAL | Status: DC
  Administered 2018-10-30 – 2018-10-31 (×2): 2.5 mg via ORAL
  Filled 2018-10-30 (×2): qty 1

## 2018-10-30 NOTE — ED Notes (Signed)
Per Dr. Rogene Houston, pt is able to eat and take regular medications

## 2018-10-30 NOTE — Care Management Obs Status (Signed)
[place] NOTIFICATION   Patient Details  Name: [name] MRN: [id number] Date of Birth: [birth year]   Medicare Observation Status Notification Given:  Yes    Ameenah Prosser Dimitri Ped, LCSW 10/30/2018, 2:42 PM

## 2018-10-30 NOTE — ED Provider Notes (Signed)
[place] [facility] Provider Note   CSN: [id number] Arrival date & time: 10/30/18  4431     History   Chief Complaint Chief Complaint  Patient presents with  . Tachycardia    HPI [name] is a 85 y.o. female.     Patient brought in by family member.  Patient woke up around 445 this morning had a loose bowel and that felt as if her heart was racing.  Patient has a history of atrial for.  Patient states she felt nauseated way the hospital but did not vomit.  Patient denied any chest pain.  She just felt unusual in her arms.  Patient states now that she feels fine.  Patient is on Cardizem for atrial fibrillation.  Patient on Eliquis for blood thinner.  Patient followed by Dr. Raliegh Ip from cardiology.  Patient had an admission for atrial fibrillation in January and had a elevation in her troponins at that time.  They felt it was secondary to the rapid heart rate.  Patient denies any chest pain the leg swelling here today.  Currently she feels completely fine.  No further diarrhea.     Past Medical History:  Diagnosis Date  . Atrial fibrillation ([place])   . IBS (irritable bowel syndrome)   . PONV (postoperative nausea and vomiting)     Patient Active Problem List   Diagnosis Date Noted  . Chest pain 10/30/2018  . AF (paroxysmal atrial fibrillation) ([place]) 10/30/2018  . Elevated troponin 10/30/2018  . Atrial fibrillation with RVR ([place]) 05/24/2018  . IBS (irritable bowel syndrome) 07/09/2014    Past Surgical History:  Procedure Laterality Date  . CATARACT EXTRACTION W/PHACO Left 02/02/2017   Procedure: CATARACT EXTRACTION PHACO AND INTRAOCULAR LENS PLACEMENT (IOC);  Surgeon: Baruch Goldmann, MD;  Location: [facility];  Service: Ophthalmology;  Laterality: Left;  CDE: 14.49  . CATARACT EXTRACTION W/PHACO Right 03/02/2017   Procedure: CATARACT EXTRACTION PHACO AND INTRAOCULAR LENS PLACEMENT ([place]);  Surgeon: Baruch Goldmann, MD;  Location: [facility];  Service: Ophthalmology;   Laterality: Right;  CDE: 12.29  . COLONOSCOPY N/A 07/24/2014   Procedure: COLONOSCOPY;  Surgeon: Rogene Houston, MD;  Location[facility];  Service: Endoscopy;  Laterality: N/A;  900  . HEMORRHOID SURGERY    . TONSILLECTOMY       OB History   No obstetric history on file.      Home Medications    Prior to Admission medications   Medication Sig Start Date End Date Taking? Authorizing Provider  ALPRAZolam (XANAX) 0.25 MG tablet Take 0.125 mg by mouth daily as needed for anxiety or sleep.    Yes [provider]  apixaban (ELIQUIS) 2.5 MG TABS tablet Take 1 tablet (2.5 mg total) by mouth 2 (two) times daily. 05/25/18  Yes Caren Griffins, MD  Cholecalciferol 1000 units capsule Take 1,000 Units by mouth daily.    Yes [provider]  citalopram (CELEXA) 20 MG tablet Take 20 mg by mouth daily.    Yes [provider]  diltiazem (CARDIZEM CD) 120 MG 24 hr capsule Take 1 capsule (120 mg total) by mouth daily. 05/26/18  Yes Gherghe, Vella Redhead, MD  hydrocortisone (ANUSOL-HC) 2.5 % rectal cream Place 1 application rectally 2 (two) times daily as needed for hemorrhoids.    Yes [provider]  hyoscyamine (LEVSIN SL) 0.125 MG SL tablet PLACE 1 TABLET UNDER TONGUE THREE TIMES DAILY AS NEEDED Patient taking differently: PLACE 1 TABLET UNDER TONGUE THREE TIMES  DAILY AS NEEDED FOR IBS 04/18/13  Yes Rehman, Mechele Dawley, MD  Lactobacillus-Inulin (CULTURELLE DIGESTIVE HEALTH PO) Take 1 tablet by mouth daily.   Yes [provider]  loratadine (CLARITIN) 10 MG tablet Take 10 mg by mouth daily.   Yes [provider]  MISC NATURAL PRODUCTS PO Take 1 tablet by mouth daily. Hair Volume   Yes [provider]  polyethylene glycol (MIRALAX) packet Take 17 g by mouth daily. 05/25/18  Yes Gherghe, Vella Redhead, MD  Propylene Glycol (SYSTANE BALANCE) 0.6 % SOLN Apply 1 drop to eye 2 (two) times a day.   Yes [provider]  SF 5000 PLUS 1.1 % CREA  dental cream USE IN PLACE OF REGULAR TOOTHPASTE, BRUSH BEFORE BEDTIME FOR TWO MINUTES, THEN EXPECTORATE. NO RINSING, EATING OR DRINKING FOR 30 MINUTES. 12/23/16  Yes [provider]  simethicone (MYLICON) 80 MG chewable tablet Chew 80 mg by mouth every 6 (six) hours as needed for flatulence.   Yes [provider]  carboxymethylcellulose (REFRESH PLUS) 0.5 % SOLN 1 drop 2 (two) times daily as needed.    [provider]  clobetasol cream (TEMOVATE) 1.61 % Apply 1 application topically 2 (two) times daily as needed. 10/05/18   [provider]    Family History No family history on file.  Social History Social History   Tobacco Use  . Smoking status: Never Smoker  . Smokeless tobacco: Never Used  Substance Use Topics  . Alcohol use: No    Alcohol/week: 0.0 standard drinks  . Drug use: No     Allergies   Mercury   Review of Systems Review of Systems  Constitutional: Positive for fatigue. Negative for chills and fever.  HENT: Negative for congestion, rhinorrhea and sore throat.   Eyes: Negative for visual disturbance.  Respiratory: Negative for cough and shortness of breath.   Cardiovascular: Positive for palpitations. Negative for chest pain and leg swelling.  Gastrointestinal: Positive for diarrhea and nausea. Negative for abdominal pain and vomiting.  Genitourinary: Negative for dysuria.  Musculoskeletal: Negative for back pain and neck pain.  Skin: Negative for rash.  Neurological: Negative for dizziness, light-headedness and headaches.  Hematological: Does not bruise/bleed easily.  Psychiatric/Behavioral: Negative for confusion.     Physical Exam Updated Vital Signs BP (!) 154/85 (BP Location: Left Arm)   Pulse 66   Temp 98.9 F (37.2 C) (Oral)   Resp 19   Ht 1.651 m (5\' 5" )   Wt 55.3 kg   SpO2 100%   BMI 20.30 kg/m   Physical Exam Vitals signs and nursing note reviewed.  Constitutional:      General: She is not in acute  distress.    Appearance: She is well-developed.  HENT:     Head: Normocephalic and atraumatic.  Eyes:     Extraocular Movements: Extraocular movements intact.     Conjunctiva/sclera: Conjunctivae normal.     Pupils: Pupils are equal, round, and reactive to light.  Neck:     Musculoskeletal: Normal range of motion and neck supple.  Cardiovascular:     Rate and Rhythm: Normal rate and regular rhythm.     Heart sounds: No murmur.  Pulmonary:     Effort: Pulmonary effort is normal. No respiratory distress.     Breath sounds: Normal breath sounds. No wheezing.  Abdominal:     General: Bowel sounds are normal.     Palpations: Abdomen is soft.     Tenderness: There is no abdominal tenderness.  Musculoskeletal: Normal range of motion.        General: Swelling present.  Skin:    General: Skin is warm and dry.  Neurological:     General: No focal deficit present.     Mental Status: She is alert and oriented to person, place, and time.      ED Treatments / Results  Labs (all labs ordered are listed, but only abnormal results are displayed) Labs Reviewed  BASIC METABOLIC PANEL - Abnormal; Notable for the following components:      Result Value   Glucose, Bld 106 (*)    GFR calc non Af Amer 55 (*)    All other components within normal limits  CBC - Abnormal; Notable for the following components:   Platelets 77 (*)    All other components within normal limits  TROPONIN I - Abnormal; Notable for the following components:   Troponin I 0.04 (*)    All other components within normal limits  SARS CORONAVIRUS 2 (HOSPITAL ORDER, [place] [facility])  TROPONIN I  TSH  VITAMIN B12  FOLATE  T4, FREE  TROPONIN I  TROPONIN I    EKG EKG Interpretation  Date/Time:  Wednesday October 30 2018 07:31:41 EDT Ventricular Rate:  69 PR Interval:    QRS Duration: 84 QT Interval:  418 QTC Calculation: 448 R Axis:   28 Text Interpretation:  Sinus rhythm Probable left atrial  enlargement Borderline low voltage, extremity leads ST depr, consider ischemia, anterolateral lds Artifact Confirmed by Fredia Sorrow [id number]) on 10/30/2018 7:35:12 AM   Radiology Dg Chest 2 View  Result Date: 10/30/2018 CLINICAL DATA:  Diarrhea. Tachycardia. History of atrial fibrillation. EXAM: CHEST - 2 VIEW COMPARISON:  May 24, 2018 FINDINGS: No pneumothorax. The heart, hila, and mediastinum are normal. No pulmonary nodules or masses. No focal infiltrates. No overt edema. No other acute abnormalities. IMPRESSION: No active cardiopulmonary disease. Electronically Signed   By: Dorise Bullion III M.D   On: 10/30/2018 08:46    Procedures Procedures (including critical care time)  Medications Ordered in ED Medications  diltiazem (CARDIZEM CD) 24 hr capsule 180 mg (180 mg Oral Not Given 10/30/18 1623)  simethicone (MYLICON) chewable tablet 80 mg (has no administration in time range)  hyoscyamine (LEVSIN SL) SL tablet 0.125 mg (has no administration in time range)  apixaban (ELIQUIS) tablet 2.5 mg (has no administration in time range)  cholecalciferol (VITAMIN D3) tablet 1,000 Units (1,000 Units Oral Given 10/30/18 1628)  loratadine (CLARITIN) tablet 10 mg (10 mg Oral Not Given 10/30/18 1626)  polyvinyl alcohol (LIQUIFILM TEARS) 1.4 % ophthalmic solution 1 drop (has no administration in time range)  polyvinyl alcohol (LIQUIFILM TEARS) 1.4 % ophthalmic solution 1 drop (has no administration in time range)  hydrocortisone (ANUSOL-HC) 2.5 % rectal cream 1 application (has no administration in time range)  ALPRAZolam (XANAX) tablet 0.125 mg (has no administration in time range)  citalopram (CELEXA) tablet 20 mg (20 mg Oral Not Given 10/30/18 1626)  aspirin EC tablet 81 mg (81 mg Oral Not Given 10/30/18 1626)  ondansetron (ZOFRAN) tablet 4 mg (has no administration in time range)    Or  ondansetron (ZOFRAN) injection 4 mg (has no administration in time range)  acetaminophen (TYLENOL) tablet 650  mg (has no administration in time range)    Or  acetaminophen (TYLENOL) suppository 650 mg (has no administration in time range)     Initial Impression / Assessment and Plan / ED Course  I have  reviewed the triage vital signs and the nursing notes.  Pertinent labs & imaging results that were available during my care of the patient were reviewed by me and considered in my medical decision making (see chart for details).        Patient without any typical chest pain symptoms.  But she did have kind of an event at home.  Did have a loose bowel movement did have some nausea no vomiting unusual sensation in her upper arm area.  No back pain no chest pain no shortness of breath however.  But EKG did show ST segment depression laterally.  Initial troponin normal.  Due to the EKG changes repeat troponin was done slightly elevated at 0.4.  Patient here in normal sinus rhythm no tachycardia.  On cardiac monitor.  Discussed with Dr. Harl Bowie from cardiology consulted on her recommending medicine admission and rule out.  Patient's chest x-ray without any acute findings.  Final Clinical Impressions(s) / ED Diagnoses   Final diagnoses:  Palpitations  Elevated troponin  ST segment depression    ED Discharge Orders    None       Fredia Sorrow, MD 10/30/18 [phone]

## 2018-10-30 NOTE — ED Triage Notes (Signed)
Pt reports woke up around 4:45 and had diarrhea then felt heart racing.  Reports has history of afib.  Pt says felt nauseated on the way to hospital but didn't vomit.  Denies pain.  Pt says can feel her heart racing.

## 2018-10-30 NOTE — Consult Note (Signed)
Cardiology Consultation:   Patient ID: SIDONIA NUTTER MRN: [id number]; DOB: 1932-11-23  Admit date: 10/30/2018 Date of Consult: 10/30/2018  Primary Care Provider: Caryl Bis, MD Primary Cardiologist: Kate Sable, MD  Primary Electrophysiologist:  None    Patient Profile:   STARLETTE THUROW is a 85 y.o. female with a hx of PAF who is being seen today for the evaluation of palpitations and elevated Troponin at the request of Dr Rogene Houston.  History of Present Illness:   Ms. Losasso is an 85 year old female with a history of PAF.  She presented May 24, 2018 with palpitations and was noted to be in AF with RVR.  She converted with rate control.  Her troponin during that admission went to 0.49.  Echocardiogram showed her ejection fraction to be 60 to 65%.  She was recently seen in a video visit by Dr. Bronson Ing and was doing well.  She has been maintained on diltiazem 120 mg a day and Eliquis 2.5 mg twice daily.  This morning she got up to use the bathroom and had tachycardia.  In the emergency room her EKGs had shown normal sinus rhythm.  Her troponin is 0.04.  Cardiology has been asked to evaluate.  Past Medical History:  Diagnosis Date  . Atrial fibrillation ([place])   . IBS (irritable bowel syndrome)   . PONV (postoperative nausea and vomiting)     Past Surgical History:  Procedure Laterality Date  . CATARACT EXTRACTION W/PHACO Left 02/02/2017   Procedure: CATARACT EXTRACTION PHACO AND INTRAOCULAR LENS PLACEMENT (IOC);  Surgeon: Baruch Goldmann, MD;  Location: [facility];  Service: Ophthalmology;  Laterality: Left;  CDE: 14.49  . CATARACT EXTRACTION W/PHACO Right 03/02/2017   Procedure: CATARACT EXTRACTION PHACO AND INTRAOCULAR LENS PLACEMENT ([place]);  Surgeon: Baruch Goldmann, MD;  Location: [facility];  Service: Ophthalmology;  Laterality: Right;  CDE: 12.29  . COLONOSCOPY N/A 07/24/2014   Procedure: COLONOSCOPY;  Surgeon: Rogene Houston, MD;  Location: [facility];  Service:  Endoscopy;  Laterality: N/A;  900  . HEMORRHOID SURGERY    . TONSILLECTOMY       Home Medications:  Prior to Admission medications   Medication Sig Start Date End Date Taking? Authorizing Provider  ALPRAZolam (XANAX) 0.25 MG tablet Take 0.125 mg by mouth daily as needed for anxiety or sleep.    Yes [provider]  apixaban (ELIQUIS) 2.5 MG TABS tablet Take 1 tablet (2.5 mg total) by mouth 2 (two) times daily. 05/25/18  Yes Caren Griffins, MD  Cholecalciferol 1000 units capsule Take 1,000 Units by mouth daily.    Yes [provider]  citalopram (CELEXA) 20 MG tablet Take 20 mg by mouth daily.    Yes [provider]  diltiazem (CARDIZEM CD) 120 MG 24 hr capsule Take 1 capsule (120 mg total) by mouth daily. 05/26/18  Yes Gherghe, Vella Redhead, MD  hydrocortisone (ANUSOL-HC) 2.5 % rectal cream Place 1 application rectally 2 (two) times daily as needed for hemorrhoids.    Yes [provider]  hyoscyamine (LEVSIN SL) 0.125 MG SL tablet PLACE 1 TABLET UNDER TONGUE THREE TIMES DAILY AS NEEDED Patient taking differently: PLACE 1 TABLET UNDER TONGUE THREE TIMES DAILY AS NEEDED FOR IBS 04/18/13  Yes Rehman, Mechele Dawley, MD  Lactobacillus-Inulin ([place] PO) Take 1 tablet by mouth daily.   Yes [provider]  loratadine (CLARITIN) 10 MG tablet Take 10 mg by mouth daily.   Yes [provider]  MISC NATURAL PRODUCTS  PO Take 1 tablet by mouth daily. Hair Volume   Yes [provider]  polyethylene glycol (MIRALAX) packet Take 17 g by mouth daily. 05/25/18  Yes Gherghe, Vella Redhead, MD  Propylene Glycol (SYSTANE BALANCE) 0.6 % SOLN Apply 1 drop to eye 2 (two) times a day.   Yes [provider]  SF 5000 PLUS 1.1 % CREA dental cream USE IN PLACE OF REGULAR TOOTHPASTE, BRUSH BEFORE BEDTIME FOR TWO MINUTES, THEN EXPECTORATE. NO RINSING, EATING OR DRINKING FOR 30 MINUTES. 12/23/16  Yes [provider]  simethicone (MYLICON) 80  MG chewable tablet Chew 80 mg by mouth every 6 (six) hours as needed for flatulence.   Yes [provider]  carboxymethylcellulose (REFRESH PLUS) 0.5 % SOLN 1 drop 2 (two) times daily as needed.    [provider]  clobetasol cream (TEMOVATE) 1.30 % Apply 1 application topically 2 (two) times daily as needed. 10/05/18   [provider]    Inpatient Medications: Scheduled Meds:  Continuous Infusions:  PRN Meds:   Allergies:    Allergies  Allergen Reactions  . Mercury Other (See Comments)    unknown    Social History:   Social History   Socioeconomic History  . Marital status: Married    Spouse name: Not on file  . Number of children: Not on file  . Years of education: Not on file  . Highest education level: Not on file  Occupational History  . Not on file  Social Needs  . Financial resource strain: Not on file  . Food insecurity    Worry: Not on file    Inability: Not on file  . Transportation needs    Medical: Not on file    Non-medical: Not on file  Tobacco Use  . Smoking status: Never Smoker  . Smokeless tobacco: Never Used  Substance and Sexual Activity  . Alcohol use: No    Alcohol/week: 0.0 standard drinks  . Drug use: No  . Sexual activity: Not on file  Lifestyle  . Physical activity    Days per week: Not on file    Minutes per session: Not on file  . Stress: Not on file  Relationships  . Social Herbalist on phone: Not on file    Gets together: Not on file    Attends religious service: Not on file    Active member of club or organization: Not on file    Attends meetings of clubs or organizations: Not on file    Relationship status: Not on file  . Intimate partner violence    Fear of current or ex partner: Not on file    Emotionally abused: Not on file    Physically abused: Not on file    Forced sexual activity: Not on file  Other Topics Concern  . Not on file  Social History Narrative  . Not on file     Family History:   Unremarkable for early CAD  ROS:  Please see the history of present illness.  All other ROS reviewed and negative.     Physical Exam/Data:   Vitals:   10/30/18 1000 10/30/18 1030 10/30/18 1100 10/30/18 1130  BP: (!) 123/101 (!) 149/64 134/68 131/68  Pulse: 76 68  60  Resp: (!) 24 19  18   Temp:      TempSrc:      SpO2: 100% 100%  100%  Weight:      Height:  No intake or output data in the 24 hours ending 10/30/18 1203 Last 3 Weights 10/30/2018 10/08/2018 06/18/2018  Weight (lbs) 122 lb 123 lb 129 lb  Weight (kg) 55.339 kg 55.792 kg 58.514 kg     Body mass index is 20.94 kg/m.  General:  Well nourished, well developed, in no acute distress HEENT: normal Lymph: no adenopathy Neck: no JVD Cardiac:  normal S1, S2; RRR; no murmur  Lungs:  clear to auscultation bilaterally, no wheezing, rhonchi or rales  Abd: soft, nontender, no hepatomegaly  Ext: no edema Musculoskeletal:  No deformities, BUE and BLE strength normal and equal Skin: warm and dry  Neuro:  CNs 2-12 intact, no focal abnormalities noted Psych:  Normal affect   EKG:  The EKG was personally reviewed and demonstrates:  NSR- HR 69- NSST changes with slight ST depression V3-V6 (? LVH)  Relevant CV Studies: Echo 05/25/2018- Study Conclusions  - Left ventricle: The cavity size was normal. Wall thickness was   normal. Systolic function was normal. The estimated ejection   fraction was in the range of 60% to 65%. Wall motion was normal;   there were no regional wall motion abnormalities. Doppler   parameters are consistent with abnormal left ventricular   relaxation (grade 1 diastolic dysfunction). - Mitral valve: There was mild regurgitation. - Pulmonary arteries: PA peak pressure: 44 mm Hg (S). - Impressions: The LV outflow trract is very narrow but no   significant LVOT obstruction. No evidence of SAM.  Impressions:  - The LV outflow trract is very narrow but no significant LVOT    obstruction. No evidence of SAM.  Laboratory Data:  Chemistry Recent Labs  Lab 10/30/18 0722  NA 139  K 4.1  CL 102  CO2 26  GLUCOSE 106*  BUN 15  CREATININE 0.95  CALCIUM 9.5  GFRNONAA 55*  GFRAA >60  ANIONGAP 11    No results for input(s): PROT, ALBUMIN, AST, ALT, ALKPHOS, BILITOT in the last 168 hours. Hematology Recent Labs  Lab 10/30/18 0722  WBC 5.6  RBC 4.50  HGB 13.5  HCT 42.7  MCV 94.9  MCH 30.0  MCHC 31.6  RDW 13.2  PLT 77*   Cardiac Enzymes Recent Labs  Lab 10/30/18 0722 10/30/18 0923  TROPONINI <0.03 0.04*   No results for input(s): TROPIPOC in the last 168 hours.  BNPNo results for input(s): BNP, PROBNP in the last 168 hours.  DDimer No results for input(s): DDIMER in the last 168 hours.  Radiology/Studies:  Dg Chest 2 View  Result Date: 10/30/2018 CLINICAL DATA:  Diarrhea. Tachycardia. History of atrial fibrillation. EXAM: CHEST - 2 VIEW COMPARISON:  May 24, 2018 FINDINGS: No pneumothorax. The heart, hila, and mediastinum are normal. No pulmonary nodules or masses. No focal infiltrates. No overt edema. No other acute abnormalities. IMPRESSION: No active cardiopulmonary disease. Electronically Signed   By: Dorise Bullion III M.D   On: 10/30/2018 08:46    Assessment and Plan:   PAF- Recurrent self limited episode this am by her history.   Elevated Troponin- I suspect this is secondary to PAF as opposed to an acute MI.   Chronic anticoagulation- CHADS VASC-3 for age and sex.  She is on low dose Eliquis secondary to age and weight.  Chronic thrombocytopenia- Plts stable by history- 77k.  Plan: MD to see. Consider changing Diltiazem to Metoprolol, or consider a PRN Metoprolol Rx for breakthrough PAF.       For questions or updates, please contact [place]  Please consult [email or web address] for contact info under     Signed, Kerin Ransom, PA-C  10/30/2018 12:03 PM   Patient seen and discussed with PA Rosalyn Gess, I agree with his  documentation. 85 yo female history of PAF, chronic thrombocytopenia but has been able to be on anticoag. During Jan 2020 admission in setting of afib with RVR trop elevation to 0.69, thought to be demand ischemia   Presents with palpitations this morning. Occurred after getting back into bed after having episode of diarrhea. No significant chest pain, did have some nausea and diaphoresis, nasuea. Checked HR and it was 140s.    K 4.1 Cr 0.95 Plt 77 WBC 5.6  Trop neg --> 0.04 EKG probable lead reversal, NSR, repeat SR lateral precordial and inferior ST depressions CXR no acute process  She has had some ST depressions in inferior and lateral precordial leads on prior EKGs, much more prominent changes in Jan when she had afib with RVR, EKG at that time fairly diffuse ST depressions and aVR elevation in setting of significant tachycardia, trop to 0.69 that admission.   ST/T changes fairly stable compared to her SR ekg Aug 2018.  Her symptoms this presentation are more consistent with symptomatic arrhytnmia as opposed to ACS.  I suspect based on prior EKG changes and trop trends she likely has chronic obstructive CAD that becomes ischemic in setting of significant tachycardia. With her baseline activities she denies any significnat chest pain or SOB. Due to prior high risk findings would admit overnight and cycle enzymes, plan for nuclear stress test tomorrow to risk stratify patient. Increase her home dilt to 180mg  daily.    Carlyle Dolly DM

## 2018-10-30 NOTE — ED Notes (Signed)
CRITICAL VALUE ALERT  Critical Value:  Troponin 0.04  Date & Time Notied:  10/30/18 1019  Provider Notified: Rogene Houston MD  Orders Received/Actions taken: n/a

## 2018-10-30 NOTE — Plan of Care (Signed)

## 2018-10-30 NOTE — H&P (Signed)
History and Physical  [place] TKP:[id number] DOB: 1932-06-04 DOA: 10/30/2018   PCP: Caryl Bis, MD   Patient coming from: Home  Chief Complaint: palpitations  HPI:  [name] is a 85 y.o. female with medical history of paroxysmal atrial fibrillation and IBS presenting with palpitations that started in the early morning 10/30/2018.  Patient awoken up to go to the bathroom around 4:30 AM on 10/20/2018.  When she returned back to bed, she experienced some palpitations with some associated nausea and diaphoresis.  She checked her heart rate and noted it to be in the 140s.  She denied any frank chest discomfort or shortness of breath or dizziness.  There is no syncopal symptoms.  The patient states that overall she has felt " more tired" in the last 6 months since she was diagnosed with atrial fibrillation in January 2020.  She had denied any frank shortness of breath, dyspnea on exertion, chest pain.  There is no fevers, chills, vomiting, abdominal pain, dysuria, hematuria.  She had 3 loose bowel movements in last 24 hours, but she states that this is normal for her.  She denies any hematochezia or melena.  In the emergency department, the patient was afebrile hemodynamically stable saturating 100% room air.  BMP and CBC were unremarkable except for platelets of 77,000.  Troponin was negative, and then 0.04.  EKG showed ST depression V3-V6 and T wave inversion II and aVL.  Cardiology was consulted to assist.  The patient was admitted for further evaluation and possible stress test 10/31/2018.  Assessment/Plan:  Paroxysmal atrial fibrillation -Patient likely had another episode in the early a.m. 10/30/2018 -She likely had some rate induced ischemia -Finish cycling troponins -Continue apixaban -Appreciate cardiology consult--> increase diltiazem CD to 180 mg daily -CHADSVASc = 3  Chronic thrombocytopenia -A.m. CBC -TSH -Serum N17 -Folic acid  Elevated troponin -Likely  secondary to atrial fibrillation -Doubt ACS -finish cycling  Irritable bowel syndrome -Continue Levsin       Past Medical History:  Diagnosis Date  . Atrial fibrillation ([place])   . IBS (irritable bowel syndrome)   . PONV (postoperative nausea and vomiting)    Past Surgical History:  Procedure Laterality Date  . CATARACT EXTRACTION W/PHACO Left 02/02/2017   Procedure: CATARACT EXTRACTION PHACO AND INTRAOCULAR LENS PLACEMENT ([facility]);  Surgeon: Baruch Goldmann, MD;  Location: [facility];  Service: Ophthalmology;  Laterality: Left;  CDE: 14.49  . CATARACT EXTRACTION W/PHACO Right 03/02/2017   Procedure: CATARACT EXTRACTION PHACO AND INTRAOCULAR LENS PLACEMENT ([place]);  Surgeon: Baruch Goldmann, MD;  Location: [facility];  Service: Ophthalmology;  Laterality: Right;  CDE: 12.29  . COLONOSCOPY N/A 07/24/2014   Procedure: COLONOSCOPY;  Surgeon: Rogene Houston, MD;  Location[facility];  Service: Endoscopy;  Laterality: N/A;  900  . HEMORRHOID SURGERY    . TONSILLECTOMY     Social History:  reports that she has never smoked. She has never used smokeless tobacco. She reports that she does not drink alcohol or use drugs.  Family History:  Unremarkable for premature CAD   Allergies  Allergen Reactions  . Mercury Other (See Comments)    unknown     Prior to Admission medications   Medication Sig Start Date End Date Taking? Authorizing Provider  ALPRAZolam (XANAX) 0.25 MG tablet Take 0.125 mg by mouth daily as needed for anxiety or sleep.    Yes [provider]  apixaban (ELIQUIS) 2.5 MG TABS tablet Take 1 tablet (  2.5 mg total) by mouth 2 (two) times daily. 05/25/18  Yes Caren Griffins, MD  Cholecalciferol 1000 units capsule Take 1,000 Units by mouth daily.    Yes [provider]  citalopram (CELEXA) 20 MG tablet Take 20 mg by mouth daily.    Yes [provider]  diltiazem (CARDIZEM CD) 120 MG 24 hr capsule Take 1 capsule (120 mg total) by mouth daily. 05/26/18   Yes Gherghe, Vella Redhead, MD  hydrocortisone (ANUSOL-HC) 2.5 % rectal cream Place 1 application rectally 2 (two) times daily as needed for hemorrhoids.    Yes [provider]  hyoscyamine (LEVSIN SL) 0.125 MG SL tablet PLACE 1 TABLET UNDER TONGUE THREE TIMES DAILY AS NEEDED Patient taking differently: PLACE 1 TABLET UNDER TONGUE THREE TIMES DAILY AS NEEDED FOR IBS 04/18/13  Yes Rehman, Mechele Dawley, MD  Lactobacillus-Inulin ([place] PO) Take 1 tablet by mouth daily.   Yes [provider]  loratadine (CLARITIN) 10 MG tablet Take 10 mg by mouth daily.   Yes [provider]  MISC NATURAL PRODUCTS PO Take 1 tablet by mouth daily. Hair Volume   Yes [provider]  polyethylene glycol (MIRALAX) packet Take 17 g by mouth daily. 05/25/18  Yes Gherghe, Vella Redhead, MD  Propylene Glycol (SYSTANE BALANCE) 0.6 % SOLN Apply 1 drop to eye 2 (two) times a day.   Yes [provider]  SF 5000 PLUS 1.1 % CREA dental cream USE IN PLACE OF REGULAR TOOTHPASTE, BRUSH BEFORE BEDTIME FOR TWO MINUTES, THEN EXPECTORATE. NO RINSING, EATING OR DRINKING FOR 30 MINUTES. 12/23/16  Yes [provider]  simethicone (MYLICON) 80 MG chewable tablet Chew 80 mg by mouth every 6 (six) hours as needed for flatulence.   Yes [provider]  carboxymethylcellulose (REFRESH PLUS) 0.5 % SOLN 1 drop 2 (two) times daily as needed.    [provider]  clobetasol cream (TEMOVATE) 1.91 % Apply 1 application topically 2 (two) times daily as needed. 10/05/18   [provider]    Review of Systems:  Constitutional:  No weight loss, night sweats, Fevers, chills,   Head&Eyes: No headache.  No vision loss.  No eye pain or scotoma ENT:  No Difficulty swallowing,Tooth/dental problems,Sore throat,  No ear ache, post nasal drip,  Cardio-vascular:  No chest pain, Orthopnea, PND, swelling in lower extremities GI:  No  abdominal pain, nausea, vomiting, diarrhea,  loss of appetite, hematochezia, melena, heartburn, indigestion, Resp:  No shortness of breath with exertion or at rest. No cough. No coughing up of blood .No wheezing.No chest wall deformity  Skin:  no rash or lesions.  GU:  no dysuria, change in color of urine, no urgency or frequency. No flank pain.  Musculoskeletal:  No joint pain or swelling. No decreased range of motion. No back pain.  Psych:  No change in mood or affect. No depression or anxiety. Neurologic: No headache, no dysesthesia, no focal weakness, no vision loss. No syncope  Physical Exam: Vitals:   10/30/18 1230 10/30/18 1300 10/30/18 1330 10/30/18 1400  BP: (!) 143/65 135/89 (!) 148/74 (!) 145/63  Pulse: 63 63  64  Resp: 20 (!) 24 17 (!) 21  Temp:      TempSrc:      SpO2: 97% 98%  100%  Weight:      Height:       General:  A&O x 3, NAD, nontoxic, pleasant/cooperative Head/Eye: No conjunctival hemorrhage, no icterus, [place]/AT, No nystagmus ENT:  No  icterus,  No thrush, good dentition, no pharyngeal exudate Neck:  No masses, no lymphadenpathy, no bruits CV:  RRR, no rub, no gallop, no S3 Lung:  CTAB, good air movement, no wheeze, no rhonchi Abdomen: soft/NT, +BS, nondistended, no peritoneal signs Ext: No cyanosis, No rashes, No petechiae, No lymphangitis, No edema Neuro: CNII-XII intact, strength 4/5 in bilateral upper and lower extremities, no dysmetria  Labs on Admission:  Basic Metabolic Panel: Recent Labs  Lab 10/30/18 0722  NA 139  K 4.1  CL 102  CO2 26  GLUCOSE 106*  BUN 15  CREATININE 0.95  CALCIUM 9.5   Liver Function Tests: No results for input(s): AST, ALT, ALKPHOS, BILITOT, PROT, ALBUMIN in the last 168 hours. No results for input(s): LIPASE, AMYLASE in the last 168 hours. No results for input(s): AMMONIA in the last 168 hours. CBC: Recent Labs  Lab 10/30/18 0722  WBC 5.6  HGB 13.5  HCT 42.7  MCV 94.9  PLT 77*   Coagulation Profile: No results for input(s): INR, PROTIME in the  last 168 hours. Cardiac Enzymes: Recent Labs  Lab 10/30/18 0722 10/30/18 0923  TROPONINI <0.03 0.04*   BNP: Invalid input(s): POCBNP CBG: No results for input(s): GLUCAP in the last 168 hours. Urine analysis:    Component Value Date/Time   COLORURINE COLORLESS (A) 05/24/2018 0843   APPEARANCEUR CLEAR 05/24/2018 0843   LABSPEC 1.003 (L) 05/24/2018 0843   PHURINE 8.0 05/24/2018 0843   GLUCOSEU NEGATIVE 05/24/2018 0843   HGBUR SMALL (A) 05/24/2018 0843   BILIRUBINUR NEGATIVE 05/24/2018 0843   KETONESUR NEGATIVE 05/24/2018 0843   PROTEINUR NEGATIVE 05/24/2018 0843   NITRITE NEGATIVE 05/24/2018 0843   LEUKOCYTESUR NEGATIVE 05/24/2018 0843   Sepsis Labs: @LABRCNTIP (procalcitonin:4,lacticidven:4) )No results found for this or any previous visit (from the past 240 hour(s)).   Radiological Exams on Admission: Dg Chest 2 View  Result Date: 10/30/2018 CLINICAL DATA:  Diarrhea. Tachycardia. History of atrial fibrillation. EXAM: CHEST - 2 VIEW COMPARISON:  May 24, 2018 FINDINGS: No pneumothorax. The heart, hila, and mediastinum are normal. No pulmonary nodules or masses. No focal infiltrates. No overt edema. No other acute abnormalities. IMPRESSION: No active cardiopulmonary disease. Electronically Signed   By: Dorise Bullion III M.D   On: 10/30/2018 08:46    EKG: Independently reviewed. Sinus, ST depression V3-V5, TWI II, aVL    Time spent:60 minutes Code Status:   FULL Family Communication:  No Family at bedside Disposition Plan: expect 1 day hospitalization Consults called: cardiology DVT Prophylaxis: apixaban  Orson Eva, DO  [facility] Pager [phone]  If 7PM-7AM, please contact night-coverage [email or web address] Password [email or web address] 10/30/2018, 2:26 PM

## 2018-10-30 NOTE — ED Notes (Signed)
Family has been updated by the patient.

## 2018-10-31 ENCOUNTER — Observation Stay (HOSPITAL_BASED_OUTPATIENT_CLINIC_OR_DEPARTMENT_OTHER): Payer: Medicare Other

## 2018-10-31 ENCOUNTER — Encounter (HOSPITAL_COMMUNITY): Payer: Self-pay

## 2018-10-31 DIAGNOSIS — I480 Paroxysmal atrial fibrillation: Secondary | ICD-10-CM | POA: Diagnosis not present

## 2018-10-31 DIAGNOSIS — R002 Palpitations: Secondary | ICD-10-CM

## 2018-10-31 DIAGNOSIS — R7989 Other specified abnormal findings of blood chemistry: Secondary | ICD-10-CM | POA: Diagnosis not present

## 2018-10-31 DIAGNOSIS — R079 Chest pain, unspecified: Secondary | ICD-10-CM | POA: Diagnosis not present

## 2018-10-31 LAB — CBC
HCT: 40.7 % (ref 36.0–46.0)
Hemoglobin: 12.9 g/dL (ref 12.0–15.0)
MCH: 29.8 pg (ref 26.0–34.0)
MCHC: 31.7 g/dL (ref 30.0–36.0)
MCV: 94 fL (ref 80.0–100.0)
Platelets: 79 10*3/uL — ABNORMAL LOW (ref 150–400)
RBC: 4.33 MIL/uL (ref 3.87–5.11)
RDW: 13.2 % (ref 11.5–15.5)
WBC: 5.3 10*3/uL (ref 4.0–10.5)
nRBC: 0 % (ref 0.0–0.2)

## 2018-10-31 LAB — NM MYOCAR MULTI W/SPECT W/WALL MOTION / EF
LV dias vol: 44 mL (ref 46–106)
LV sys vol: 9 mL
RATE: 0.36
SDS: 1
SRS: 2
SSS: 3
TID: 1.13

## 2018-10-31 MED ORDER — SODIUM CHLORIDE 0.9% FLUSH
INTRAVENOUS | Status: AC
  Administered 2018-10-31: 09:00:00 via INTRAVENOUS
  Filled 2018-10-31: qty 10

## 2018-10-31 MED ORDER — VITAMIN B-12 100 MCG PO TABS: 500.0000 ug | ORAL_TABLET | Freq: Every day | ORAL | Status: DC

## 2018-10-31 MED ORDER — CYANOCOBALAMIN 500 MCG PO TABS: 500.0000 ug | ORAL_TABLET | Freq: Every day | ORAL | Status: AC

## 2018-10-31 MED ORDER — DILTIAZEM HCL ER COATED BEADS 180 MG PO CP24: 180.0000 mg | ORAL_CAPSULE | Freq: Every day | ORAL | 1 refills | Status: DC

## 2018-10-31 MED ORDER — REGADENOSON 0.4 MG/5ML IV SOLN
INTRAVENOUS | Status: AC
  Administered 2018-10-31: 09:00:00 0.08 mg via INTRAVENOUS
  Filled 2018-10-31: qty 5

## 2018-10-31 MED ORDER — TECHNETIUM TC 99M TETROFOSMIN IV KIT
10.0000 | PACK | Freq: Once | INTRAVENOUS | Status: AC | PRN
  Administered 2018-10-31: 10.5 via INTRAVENOUS

## 2018-10-31 MED ORDER — TECHNETIUM TC 99M TETROFOSMIN IV KIT
30.0000 | PACK | Freq: Once | INTRAVENOUS | Status: AC | PRN
  Administered 2018-10-31: 31 via INTRAVENOUS

## 2018-10-31 NOTE — Discharge Summary (Signed)
Physician Discharge Summary  Ree Shay ZOX:[id number] DOB: Mar 23, 1933 DOA: 10/30/2018  PCP: Caryl Bis, MD  Admit date: 10/30/2018 Discharge date: 10/31/2018  Admitted From: Home Disposition:  Home   Recommendations for Outpatient Follow-up:  1. Follow up with PCP in 1-2 weeks 2. Please obtain BMP/CBC in one week    Discharge Condition: Stable CODE STATUS: FULL Diet recommendation: Heart Healthy    Brief/Interim Summary: 85 y.o. female with medical history of paroxysmal atrial fibrillation and IBS presenting with palpitations that started in the early morning 10/30/2018.  Patient awoken up to go to the bathroom around 4:30 AM on 10/20/2018.  When she returned back to bed, she experienced some palpitations with some associated nausea and diaphoresis.  She checked her heart rate and noted it to be in the 140s.  She denied any frank chest discomfort or shortness of breath or dizziness.  There is no syncopal symptoms.  The patient states that overall she has felt " more tired" in the last 6 months since she was diagnosed with atrial fibrillation in January 2020.  She had denied any frank shortness of breath, dyspnea on exertion, chest pain.  There is no fevers, chills, vomiting, abdominal pain, dysuria, hematuria.  She had 3 loose bowel movements in last 24 hours, but she states that this is normal for her.  She denies any hematochezia or melena.  In the emergency department, the patient was afebrile hemodynamically stable saturating 100% room air.  BMP and CBC were unremarkable except for platelets of 77,000.  Troponin was negative, and then 0.04.  EKG showed ST depression V3-V6 and T wave inversion II and aVL.  Cardiology was consulted to assist.  The patient was admitted for further evaluation.  She underwent NM myoview on 10/31/18 which was low risk.  She was d/c with increased diltiazem dose 180 mg daily.  Discharge Diagnoses:  Paroxysmal atrial fibrillation -Patient likely had  another episode in the early a.m. 10/30/2018 -She likely had some rate induced ischemia -now back in sinus again -Finish cycling troponins--trend is flat -Continue apixaban -Appreciate cardiology consult-->increase diltiazem CD to 180 mg daily -CHADSVASc = 3 -11/10/18 myoview--low risk, EF >65%  Chronic thrombocytopenia -A.m. CBC -TSH--3.174 -Serum W11--91--->YNWGNFA -Folic OZHY--8.6  Elevated troponin -Likely secondary to atrial fibrillation -Doubt ACS -finish cycling---trend is flat  Irritable bowel syndrome -Continue Levsin   Discharge Instructions   Allergies as of 10/31/2018      Reactions   Mercury Other (See Comments)   unknown      Medication List    TAKE these medications   ALPRAZolam 0.25 MG tablet Commonly known as: XANAX Take 0.125 mg by mouth daily as needed for anxiety or sleep.   apixaban 2.5 MG Tabs tablet Commonly known as: ELIQUIS Take 1 tablet (2.5 mg total) by mouth 2 (two) times daily.   carboxymethylcellulose 0.5 % Soln Commonly known as: REFRESH PLUS 1 drop 2 (two) times daily as needed.   Cholecalciferol 25 MCG (1000 UT) capsule Take 1,000 Units by mouth daily.   citalopram 20 MG tablet Commonly known as: CELEXA Take 20 mg by mouth daily.   clobetasol cream 0.05 % Commonly known as: TEMOVATE Apply 1 application topically 2 (two) times daily as needed.   CULTURELLE DIGESTIVE HEALTH PO Take 1 tablet by mouth daily.   diltiazem 180 MG 24 hr capsule Commonly known as: CARDIZEM CD Take 1 capsule (180 mg total) by mouth daily. Start taking on: November 01, 2018 What changed:   medication  strength  how much to take   hydrocortisone 2.5 % rectal cream Commonly known as: ANUSOL-HC Place 1 application rectally 2 (two) times daily as needed for hemorrhoids.   hyoscyamine 0.125 MG SL tablet Commonly known as: LEVSIN SL PLACE 1 TABLET UNDER TONGUE THREE TIMES DAILY AS NEEDED What changed: See the new instructions.   loratadine  10 MG tablet Commonly known as: CLARITIN Take 10 mg by mouth daily.   MISC NATURAL PRODUCTS PO Take 1 tablet by mouth daily. Hair Volume   polyethylene glycol 17 g packet Commonly known as: MiraLax Take 17 g by mouth daily.   SF 5000 Plus 1.1 % Crea dental cream Generic drug: sodium fluoride USE IN PLACE OF REGULAR TOOTHPASTE, BRUSH BEFORE BEDTIME FOR TWO MINUTES, THEN EXPECTORATE. NO RINSING, EATING OR DRINKING FOR 30 MINUTES.   simethicone 80 MG chewable tablet Commonly known as: MYLICON Chew 80 mg by mouth every 6 (six) hours as needed for flatulence.   Systane Balance 0.6 % Soln Generic drug: Propylene Glycol Apply 1 drop to eye 2 (two) times a day.   vitamin B-12 500 MCG tablet Commonly known as: CYANOCOBALAMIN Take 1 tablet (500 mcg total) by mouth daily.       Allergies  Allergen Reactions  . Mercury Other (See Comments)    unknown    Consultations:  cardiology   Procedures/Studies: Dg Chest 2 View  Result Date: 10/30/2018 CLINICAL DATA:  Diarrhea. Tachycardia. History of atrial fibrillation. EXAM: CHEST - 2 VIEW COMPARISON:  May 24, 2018 FINDINGS: No pneumothorax. The heart, hila, and mediastinum are normal. No pulmonary nodules or masses. No focal infiltrates. No overt edema. No other acute abnormalities. IMPRESSION: No active cardiopulmonary disease. Electronically Signed   By: Dorise Bullion III M.D   On: 10/30/2018 08:46   Nm Myocar Multi W/spect Tamela Oddi Motion / Ef  Result Date: 10/31/2018  The study is normal.  This is a low risk study.  The left ventricular ejection fraction is hyperdynamic (>65%).  Downsloping ST segment depression ST segment depression was noted during stress in the V4, V5, V6, aVL and I leads.  Patient did have some ST depression with pharmacologic lexiscan injection However perfusion images were normal with no ischemia or infarct and hyperdynamic LV function EF 80%        Discharge Exam: Vitals:   10/30/18 2103  10/31/18 0546  BP: 127/64 134/71  Pulse: 65 65  Resp: 16 17  Temp: 98.2 F (36.8 C) 98.5 F (36.9 C)  SpO2: 100% 98%   Vitals:   10/30/18 1430 10/30/18 1512 10/30/18 2103 10/31/18 0546  BP: 140/64 (!) 154/85 127/64 134/71  Pulse: 66 66 65 65  Resp: (!) 22 19 16 17   Temp:  98.9 F (37.2 C) 98.2 F (36.8 C) 98.5 F (36.9 C)  TempSrc:  Oral Oral Oral  SpO2: 100% 100% 100% 98%  Weight:  55.3 kg    Height:  5\' 5"  (1.651 m)      General: Pt is alert, awake, not in acute distress Cardiovascular: RRR, S1/S2 +, no rubs, no gallops Respiratory: CTA bilaterally, no wheezing, no rhonchi Abdominal: Soft, NT, ND, bowel sounds + Extremities: no edema, no cyanosis   The results of significant diagnostics from this hospitalization (including imaging, microbiology, ancillary and laboratory) are listed below for reference.    Significant Diagnostic Studies: Dg Chest 2 View  Result Date: 10/30/2018 CLINICAL DATA:  Diarrhea. Tachycardia. History of atrial fibrillation. EXAM: CHEST - 2 VIEW COMPARISON:  May 24, 2018 FINDINGS: No pneumothorax. The heart, hila, and mediastinum are normal. No pulmonary nodules or masses. No focal infiltrates. No overt edema. No other acute abnormalities. IMPRESSION: No active cardiopulmonary disease. Electronically Signed   By: Dorise Bullion III M.D   On: 10/30/2018 08:46   Nm Myocar Multi W/spect Tamela Oddi Motion / Ef  Result Date: 10/31/2018  The study is normal.  This is a low risk study.  The left ventricular ejection fraction is hyperdynamic (>65%).  Downsloping ST segment depression ST segment depression was noted during stress in the V4, V5, V6, aVL and I leads.  Patient did have some ST depression with pharmacologic lexiscan injection However perfusion images were normal with no ischemia or infarct and hyperdynamic LV function EF 80%     Microbiology: Recent Results (from the past 240 hour(s))  SARS Coronavirus 2 (CEPHEID - Performed in [place] hospital lab), Hosp Order     Status: None   Collection Time: 10/30/18  1:03 PM   Specimen: Nasopharyngeal Swab  Result Value Ref Range Status   SARS Coronavirus 2 NEGATIVE NEGATIVE Final    Comment: (NOTE) If result is NEGATIVE SARS-CoV-2 target nucleic acids are NOT DETECTED. The SARS-CoV-2 RNA is generally detectable in upper and lower  respiratory specimens during the acute phase of infection. The lowest  concentration of SARS-CoV-2 viral copies this assay can detect is 250  copies / mL. A negative result does not preclude SARS-CoV-2 infection  and should not be used as the sole basis for treatment or other  patient management decisions.  A negative result may occur with  improper specimen collection / handling, submission of specimen other  than nasopharyngeal swab, presence of viral mutation(s) within the  areas targeted by this assay, and inadequate number of viral copies  (<250 copies / mL). A negative result must be combined with clinical  observations, patient history, and epidemiological information. If result is POSITIVE SARS-CoV-2 target nucleic acids are DETECTED. The SARS-CoV-2 RNA is generally detectable in upper and lower  respiratory specimens dur ing the acute phase of infection.  Positive  results are indicative of active infection with SARS-CoV-2.  Clinical  correlation with patient history and other diagnostic information is  necessary to determine patient infection status.  Positive results do  not rule out bacterial infection or co-infection with other viruses. If result is PRESUMPTIVE POSTIVE SARS-CoV-2 nucleic acids MAY BE PRESENT.   A presumptive positive result was obtained on the submitted specimen  and confirmed on repeat testing.  While 2019 novel coronavirus  (SARS-CoV-2) nucleic acids may be present in the submitted sample  additional confirmatory testing may be necessary for epidemiological  and / or clinical management purposes  to  differentiate between  SARS-CoV-2 and other Sarbecovirus currently known to infect humans.  If clinically indicated additional testing with an alternate test  methodology [id number]) is advised. The SARS-CoV-2 RNA is generally  detectable in upper and lower respiratory sp ecimens during the acute  phase of infection. The expected result is Negative. Fact Sheet for Patients:  StrictlyIdeas.no Fact Sheet for Healthcare Providers: BankingDealers.co.za This test is not yet approved or cleared by the [place] FDA and has been authorized for detection and/or diagnosis of SARS-CoV-2 by FDA under an Emergency Use Authorization (EUA).  This EUA will remain in effect (meaning this test can be used) for the duration of the COVID-19 declaration under Section 564(b)(1) of the Act, 21 U.S.C. section 360bbb-3(b)(1), unless the authorization is terminated or revoked  sooner. Performed at [facility], [address], [place], [place] [address]      Labs: Basic Metabolic Panel: Recent Labs  Lab 10/30/18 0722  NA 139  K 4.1  CL 102  CO2 26  GLUCOSE 106*  BUN 15  CREATININE 0.95  CALCIUM 9.5   Liver Function Tests: No results for input(s): AST, ALT, ALKPHOS, BILITOT, PROT, ALBUMIN in the last 168 hours. No results for input(s): LIPASE, AMYLASE in the last 168 hours. No results for input(s): AMMONIA in the last 168 hours. CBC: Recent Labs  Lab 10/30/18 0722 10/31/18 0551  WBC 5.6 5.3  HGB 13.5 12.9  HCT 42.7 40.7  MCV 94.9 94.0  PLT 77* 79*   Cardiac Enzymes: Recent Labs  Lab 10/30/18 0722 10/30/18 0923 10/30/18 1547 10/30/18 2116  TROPONINI <0.03 0.04* 0.06* 0.06*   BNP: Invalid input(s): POCBNP CBG: No results for input(s): GLUCAP in the last 168 hours.  Time coordinating discharge:  36 minutes  Signed:  Orson Eva, DO [facility] Pager: [phone] 10/31/2018, 1:11 PM

## 2018-10-31 NOTE — Consult Note (Signed)
Cardiology Consultation:   Patient ID: [name]; [id number]; August 27, 1932   Admit date: 10/30/2018 Date of Consult: 10/31/2018  Primary Care Provider: Caryl Bis, MD Primary Cardiologist: Kate Sable, MD 10/08/2018 Virtual visit Primary Electrophysiologist:  None   Patient Profile:   [name] is a 85 y.o. female with a hx of PAF, HTN, IBS, CHA2DS2-VASc = 4 (age x 2, female, HTN) on Eliquis, who is being seen today for the evaluation of palpitations and elevated troponin at the request of Dr Maudie Mercury.  History of Present Illness:   [name] was in her USOH when she got up yesterday. She went to the bathroom and went back to bed. She felt her heart start racing. It happened the same way in January when she was diagnosed. First episode since then.  Her HR was 147, she felt a little weak. No chest pain or SOB, not light-headed. She came to the ER. In the ER, she was in [place], but had ST depression on ECG. She was admitted.  She has had no more Afib overnight. She does not get palpitations, has not had any since January event.   She does not get chest pain. She does housework and has flowers, husband helps her. She does not feel limited in her activity by SOB, DOE.   Recently had diarrhea, but no other illnesses.   Currently feels well.    Past Medical History:  Diagnosis Date  . Atrial fibrillation ([place])   . Hypertension   . IBS (irritable bowel syndrome)   . PONV (postoperative nausea and vomiting)     Past Surgical History:  Procedure Laterality Date  . CATARACT EXTRACTION W/PHACO Left 02/02/2017   Procedure: CATARACT EXTRACTION PHACO AND INTRAOCULAR LENS PLACEMENT (IOC);  Surgeon: Baruch Goldmann, MD;  Location: [facility];  Service: Ophthalmology;  Laterality: Left;  CDE: 14.49  . CATARACT EXTRACTION W/PHACO Right 03/02/2017   Procedure: CATARACT EXTRACTION PHACO AND INTRAOCULAR LENS PLACEMENT ([place]);  Surgeon: Baruch Goldmann, MD;  Location: [facility];  Service:  Ophthalmology;  Laterality: Right;  CDE: 12.29  . COLONOSCOPY N/A 07/24/2014   Procedure: COLONOSCOPY;  Surgeon: Rogene Houston, MD;  Location: [facility] SU[facility];  Service: Endoscopy;  Laterality: N/A;  900  . HEMORRHOID SURGERY    . TONSILLECTOMY       Prior to Admission medications   Medication Sig Start Date End Date Taking? Authorizing Provider  ALPRAZolam (XANAX) 0.25 MG tablet Take 0.125 mg by mouth daily as needed for anxiety or sleep.    Yes [provider]  apixaban (ELIQUIS) 2.5 MG TABS tablet Take 1 tablet (2.5 mg total) by mouth 2 (two) times daily. 05/25/18  Yes Caren Griffins, MD  Cholecalciferol 1000 units capsule Take 1,000 Units by mouth daily.    Yes [provider]  citalopram (CELEXA) 20 MG tablet Take 20 mg by mouth daily.    Yes [provider]  diltiazem (CARDIZEM CD) 120 MG 24 hr capsule Take 1 capsule (120 mg total) by mouth daily. 05/26/18  Yes Gherghe, Vella Redhead, MD  hydrocortisone (ANUSOL-HC) 2.5 % rectal cream Place 1 application rectally 2 (two) times daily as needed for hemorrhoids.    Yes [provider]  hyoscyamine (LEVSIN SL) 0.125 MG SL tablet PLACE 1 TABLET UNDER TONGUE THREE TIMES DAILY AS NEEDED Patient taking differently: PLACE 1 TABLET UNDER TONGUE THREE TIMES DAILY AS NEEDED FOR IBS 04/18/13  Yes Rehman, Mechele Dawley, MD  Lactobacillus-Inulin ([place]) Take  1 tablet by mouth daily.   Yes [provider]  loratadine (CLARITIN) 10 MG tablet Take 10 mg by mouth daily.   Yes [provider]  MISC NATURAL PRODUCTS PO Take 1 tablet by mouth daily. Hair Volume   Yes [provider]  polyethylene glycol (MIRALAX) packet Take 17 g by mouth daily. 05/25/18  Yes Gherghe, Vella Redhead, MD  Propylene Glycol (SYSTANE BALANCE) 0.6 % SOLN Apply 1 drop to eye 2 (two) times a day.   Yes [provider]  SF 5000 PLUS 1.1 % CREA dental cream USE IN PLACE OF REGULAR TOOTHPASTE, BRUSH BEFORE  BEDTIME FOR TWO MINUTES, THEN EXPECTORATE. NO RINSING, EATING OR DRINKING FOR 30 MINUTES. 12/23/16  Yes [provider]  simethicone (MYLICON) 80 MG chewable tablet Chew 80 mg by mouth every 6 (six) hours as needed for flatulence.   Yes [provider]  carboxymethylcellulose (REFRESH PLUS) 0.5 % SOLN 1 drop 2 (two) times daily as needed.    [provider]  clobetasol cream (TEMOVATE) 3.08 % Apply 1 application topically 2 (two) times daily as needed. 10/05/18   [provider]    Inpatient Medications: Scheduled Meds: . regadenoson      . sodium chloride flush      . apixaban  2.5 mg Oral BID  . aspirin EC  81 mg Oral Daily  . cholecalciferol  1,000 Units Oral Daily  . citalopram  20 mg Oral Daily  . diltiazem  180 mg Oral Daily  . loratadine  10 mg Oral Daily  . polyvinyl alcohol  1 drop Both Eyes BID   Continuous Infusions:  PRN Meds: acetaminophen **OR** acetaminophen, ALPRAZolam, hydrocortisone, hyoscyamine, ondansetron **OR** ondansetron (ZOFRAN) IV, polyvinyl alcohol, simethicone, technetium tetrofosmin  Allergies:    Allergies  Allergen Reactions  . Mercury Other (See Comments)    unknown    Social History:   Social History   Socioeconomic History  . Marital status: Married    Spouse name: Not on file  . Number of children: Not on file  . Years of education: Not on file  . Highest education level: Not on file  Occupational History  . Not on file  Social Needs  . Financial resource strain: Not on file  . Food insecurity    Worry: Not on file    Inability: Not on file  . Transportation needs    Medical: Not on file    Non-medical: Not on file  Tobacco Use  . Smoking status: Never Smoker  . Smokeless tobacco: Never Used  Substance and Sexual Activity  . Alcohol use: No    Alcohol/week: 0.0 standard drinks  . Drug use: No  . Sexual activity: Not on file  Lifestyle  . Physical activity    Days per week: Not on file     Minutes per session: Not on file  . Stress: Not on file  Relationships  . Social Herbalist on phone: Not on file    Gets together: Not on file    Attends religious service: Not on file    Active member of club or organization: Not on file    Attends meetings of clubs or organizations: Not on file    Relationship status: Not on file  . Intimate partner violence    Fear of current or ex partner: Not on file    Emotionally abused: Not on file    Physically abused: Not on file  Forced sexual activity: Not on file  Other Topics Concern  . Not on file  Social History Narrative  . Not on file    Family History:   Family History  Problem Relation Age of Onset  . Congestive Heart Failure Mother    Family Status:  Family Status  Relation Name Status  . Mother  Deceased       CHF  . Father  Deceased       chf  . Sister 1 Deceased       colon and lung, breast cancer  . Brother 1 Alive       hx of colon and kidney cancer    ROS:  Please see the history of present illness.  All other ROS reviewed and negative.     Physical Exam/Data:   Vitals:   10/30/18 1430 10/30/18 1512 10/30/18 2103 10/31/18 0546  BP: 140/64 (!) 154/85 127/64 134/71  Pulse: 66 66 65 65  Resp: (!) 22 19 16 17   Temp:  98.9 F (37.2 C) 98.2 F (36.8 C) 98.5 F (36.9 C)  TempSrc:  Oral Oral Oral  SpO2: 100% 100% 100% 98%  Weight:  55.3 kg    Height:  5\' 5"  (1.651 m)     No intake or output data in the 24 hours ending 10/31/18 0848 Filed Weights   10/30/18 0713 10/30/18 1512  Weight: 55.3 kg 55.3 kg   Body mass index is 20.3 kg/m.  General:  Well developed, frail elderly female, in no acute distress HEENT: normal Lymph: no adenopathy Neck: no JVD Endocrine:  No thryomegaly Vascular: No carotid bruits; 4/4 extremity pulses 2+, without bruits  Cardiac:  normal S1, S2; RRR; no murmur  Lungs:  clear to auscultation bilaterally, no wheezing, rhonchi or rales  Abd: soft, nontender, no  hepatomegaly  Ext: no edema Musculoskeletal:  No deformities, BUE and BLE strength normal and equal Skin: warm and dry  Neuro:  CNs 2-12 intact, no focal abnormalities noted Psych:  Normal affect   EKG:  The EKG was personally reviewed and demonstrates:  06/17 7:22 am, SR w/ lateral ST depression and T wave changes leads I, II 06/17 7:31 ECG, T wave changes have normalized, but ST depression persists Telemetry:  Telemetry was personally reviewed and demonstrates:  SR  Relevant CV Studies:  ECHO:  CATH:   Laboratory Data:  Chemistry Recent Labs  Lab 10/30/18 0722  NA 139  K 4.1  CL 102  CO2 26  GLUCOSE 106*  BUN 15  CREATININE 0.95  CALCIUM 9.5  GFRNONAA 55*  GFRAA >60  ANIONGAP 11    Lab Results  Component Value Date   ALT 16 05/24/2018   AST 19 05/24/2018   ALKPHOS 59 05/24/2018   BILITOT 0.7 05/24/2018   Hematology Recent Labs  Lab 10/30/18 0722 10/31/18 0551  WBC 5.6 5.3  RBC 4.50 4.33  HGB 13.5 12.9  HCT 42.7 40.7  MCV 94.9 94.0  MCH 30.0 29.8  MCHC 31.6 31.7  RDW 13.2 13.2  PLT 77* 79*   Cardiac Enzymes Recent Labs  Lab 10/30/18 0722 10/30/18 0923 10/30/18 1547 10/30/18 2116  TROPONINI <0.03 0.04* 0.06* 0.06*   TSH:  Lab Results  Component Value Date   TSH 3.174 10/30/2018   Lipids:No results found for: CHOL, HDL, LDLCALC, LDLDIRECT, TRIG, CHOLHDL  Magnesium:  Magnesium  Date Value Ref Range Status  05/24/2018 2.0 1.7 - 2.4 mg/dL Final    Comment:    Performed at  [address] [place], [place] [address]     Radiology/Studies:  Dg Chest 2 View  Result Date: 10/30/2018 CLINICAL DATA:  Diarrhea. Tachycardia. History of atrial fibrillation. EXAM: CHEST - 2 VIEW COMPARISON:  May 24, 2018 FINDINGS: No pneumothorax. The heart, hila, and mediastinum are normal. No pulmonary nodules or masses. No focal infiltrates. No overt edema. No other acute abnormalities. IMPRESSION: No active cardiopulmonary disease.  Electronically Signed   By: Dorise Bullion III M.D   On: 10/30/2018 08:46    Assessment and Plan:   Active Problems:   Chest pain   AF (paroxysmal atrial fibrillation) (HCC)   Elevated troponin     For questions or updates, please contact [place] Please consult [email or web address] for contact info under Cardiology/STEMI.   Signed, Rosaria Ferries, PA-C  10/31/2018 8:48 AM  Patient having nuclear study Troponin with minimal elevation 0.04-0.06 with no trend ECG with NSR lateral T wave changes. Myovue would need to be very abnormal to recommend cath in this 85 yo patient. May be having isolated episodes of PAF but she is already on cardizem and eliquis. Can consider outpatient Zio Patch if her palpitations become more freuqent / bothersome Will try to read nuclear study latter today when images available  Jenkins Rouge

## 2018-10-31 NOTE — Plan of Care (Signed)

## 2018-10-31 NOTE — Progress Notes (Signed)
   Ree Shay presented for a nuclear stress test today.  No immediate complications.  Stress imaging is pending at this time.  Preliminary EKG findings may be listed in the chart, but the stress test result will not be finalized until perfusion imaging is complete.  1 day study, [facility] to read.  Rosaria Ferries, PA-C 10/31/2018, 9:34 AM  3

## 2018-11-06 ENCOUNTER — Telehealth: Payer: Self-pay | Admitting: *Deleted

## 2018-11-06 NOTE — Telephone Encounter (Signed)
Can follow up in November.

## 2018-11-06 NOTE — Telephone Encounter (Signed)
Patient notified and verbalized understanding. 

## 2018-11-06 NOTE — Telephone Encounter (Signed)
Recent admission to [facility], stayed one night.  Her f/u stated to see pcp in 1-2 weeks.  Did not see specific timing or need for cardiology follow up.  Patient states she is doing fine now.  Routine 6 mo f/u due in November.  Will forward to provider to see if sooner follow up is needed.

## 2018-12-16 ENCOUNTER — Telehealth: Payer: Self-pay | Admitting: Cardiovascular Disease

## 2018-12-16 NOTE — Telephone Encounter (Signed)
New Message     Pt c/o of Chest Pain: STAT if CP now or developed within 24 hours   1. Are you having CP right now? Chest Tightness, not currently    2. Are you experiencing any other symptoms (ex. SOB, nausea, vomiting, sweating)? Weakness  3. How long have you been experiencing CP? A couple of weeks   4. Is your CP continuous or coming and going? Comes and Goes   5. Have you taken Nitroglycerin? No  ?

## 2018-12-16 NOTE — Telephone Encounter (Signed)
Patient states she doesn't think it is "really nothing" but she wants to be sure she is ok to go on vacation 8/30 and wants to be checked out.She states she may have a ""second" of CP one day and then not have any other pains for 3-4 days.She had Cardizem increased from 120 mg to 180 mg and reports not irregular heart beats. She will see R.Barrett PA-C on 12/26/2018 at 0930 in the [place] office

## 2018-12-25 NOTE — Progress Notes (Signed)
Cardiology Office Note   Date:  12/26/2018   ID:  Keandria, Berrocal 20-Dec-1932, MRN [id number]  PCP:  Caryl Bis, MD Cardiologist:  Kate Sable, MD 10/08/2018 [name] Yvonne Hanke, PA-C   No chief complaint on file.   History of Present Illness: [name] is a 85 y.o. female with a history of PAF, HTN, IBS, CHA2DS2-VASc = 4 (age x 2, female, HTN) on Eliquis.  Admitted 06/17-06/18/2020 with palpitations, Afib RVR, ECG w/ some ST depression, continue Cardizem and Eliquis, outpt Zio if palps get worse, MV w/ ECG abnl but perfusion good.  [name] presents for cardiology follow up.  Occasionally, in the L upper chest, she gets a slight pain. Cannot really say what it feels like. It lasts on a second. It resolves spontaneously. Not exertional. Does not cause SOB. Does not happen every day.   She has felt better on the higher dose of Cardizem. Her BP is generally well-controlled, she takes it daily. HR in the 50s at times, no sx from this.   Did not get palpitations w/ the Afib, just felt generally bad. Has not had that feeling since d/c.  She does her housework, goes slowly but gets it done. No recent change in her exertion level.   Walks at times, but is not exercising consistently.   No LE edema, no orthopnea or PND.   No presyncope or syncope  She is going to [facility], was anxious about the chest pain, that is why she came in.   Past Medical History:  Diagnosis Date  . Atrial fibrillation ([place])   . Hypertension   . IBS (irritable bowel syndrome)   . PONV (postoperative nausea and vomiting)     Past Surgical History:  Procedure Laterality Date  . CATARACT EXTRACTION W/PHACO Left 02/02/2017   Procedure: CATARACT EXTRACTION PHACO AND INTRAOCULAR LENS PLACEMENT (IOC);  Surgeon: Baruch Goldmann, MD;  Location: [facility];  Service: Ophthalmology;  Laterality: Left;  CDE: 14.49  . CATARACT EXTRACTION W/PHACO Right 03/02/2017   Procedure:  CATARACT EXTRACTION PHACO AND INTRAOCULAR LENS PLACEMENT ([place]);  Surgeon: Baruch Goldmann, MD;  Location: [facility] OR[facility];  Service: Ophthalmology;  Laterality: Right;  CDE: 12.29  . COLONOSCOPY N/A 07/24/2014   Procedure: COLONOSCOPY;  Surgeon: Rogene Houston, MD;  Location: [facility] ENDO SUITE;  Service: Endoscopy;  Laterality: N/A;  900  . HEMORRHOID SURGERY    . TONSILLECTOMY      Current Outpatient Medications  Medication Sig Dispense Refill  . ALPRAZolam (XANAX) 0.25 MG tablet Take 0.125 mg by mouth daily as needed for anxiety or sleep.     Marland Kitchen apixaban (ELIQUIS) 2.5 MG TABS tablet Take 1 tablet (2.5 mg total) by mouth 2 (two) times daily. 60 tablet 1  . carboxymethylcellulose (REFRESH PLUS) 0.5 % SOLN 1 drop 2 (two) times daily as needed.    . Cholecalciferol 1000 units capsule Take 1,000 Units by mouth daily.     . citalopram (CELEXA) 20 MG tablet Take 20 mg by mouth daily.     . clobetasol cream (TEMOVATE) 7.34 % Apply 1 application topically 2 (two) times daily as needed.    . diltiazem (CARDIZEM CD) 180 MG 24 hr capsule Take 1 capsule (180 mg total) by mouth daily. 30 capsule 1  . hydrocortisone (ANUSOL-HC) 2.5 % rectal cream Place 1 application rectally 2 (two) times daily as needed for hemorrhoids.     . hyoscyamine (LEVSIN SL) 0.125 MG SL tablet PLACE 1  TABLET UNDER TONGUE THREE TIMES DAILY AS NEEDED (Patient taking differently: PLACE 1 TABLET UNDER TONGUE THREE TIMES DAILY AS NEEDED FOR IBS) 90 tablet 2  . Lactobacillus-Inulin (CULTURELLE DIGESTIVE HEALTH PO) Take 1 tablet by mouth daily.    Marland Kitchen loratadine (CLARITIN) 10 MG tablet Take 10 mg by mouth daily.    Marland Kitchen MISC NATURAL PRODUCTS PO Take 1 tablet by mouth daily. Hair Volume    . polyethylene glycol (MIRALAX) packet Take 17 g by mouth daily. 30 packet 1  . Propylene Glycol (SYSTANE BALANCE) 0.6 % SOLN Apply 1 drop to eye 2 (two) times a day.    . SF 5000 PLUS 1.1 % CREA dental cream USE IN PLACE OF REGULAR TOOTHPASTE, BRUSH BEFORE BEDTIME FOR TWO  MINUTES, THEN EXPECTORATE. NO RINSING, EATING OR DRINKING FOR 30 MINUTES.  99  . simethicone (MYLICON) 80 MG chewable tablet Chew 80 mg by mouth every 6 (six) hours as needed for flatulence.    . vitamin B-12 (CYANOCOBALAMIN) 500 MCG tablet Take 1 tablet (500 mcg total) by mouth daily.     No current facility-administered medications for this visit.     Allergies:   Mercury    Social History:  The patient  reports that she has never smoked. She has never used smokeless tobacco. She reports that she does not drink alcohol or use drugs.   Family History:  The patient's family history includes Congestive Heart Failure in her mother.  She indicated that her mother is deceased. She indicated that her father is deceased. She indicated that her sister is deceased. She indicated that her brother is alive.    ROS:  Please see the history of present illness. All other systems are reviewed and negative.    PHYSICAL EXAM: VS:  BP (!) 157/78   Pulse 82   Ht 5\' 4"  (1.626 m)   Wt 125 lb 9.6 oz (57 kg)   SpO2 99% Comment: on room air  BMI 21.56 kg/m  , BMI Body mass index is 21.56 kg/m. GEN: Well nourished, well developed, female in no acute distress HEENT: normal for age  Neck: no JVD, no carotid bruit, no masses Cardiac: RRR; no murmur, no rubs, or gallops Respiratory:  clear to auscultation bilaterally, normal work of breathing GI: soft, nontender, nondistended, + BS MS: no deformity or atrophy; no edema; distal pulses are 2+ in all 4 extremities  Skin: warm and dry, no rash Neuro:  Strength and sensation are intact Psych: euthymic mood, full affect   EKG:  EKG is ordered today. The ekg ordered today demonstrates sinus rhythm, heart rate 79, lateral ST depression is similar to that seen on 6/17 ECG  ECHO: 05/25/2018 - Left ventricle: The cavity size was normal. Wall thickness was   normal. Systolic function was normal. The estimated ejection   fraction was in the range of 60% to  65%. Wall motion was normal;   there were no regional wall motion abnormalities. Doppler   parameters are consistent with abnormal left ventricular   relaxation (grade 1 diastolic dysfunction). - Mitral valve: There was mild regurgitation. - Pulmonary arteries: PA peak pressure: 44 mm Hg (S). - Impressions: The LV outflow trract is very narrow but no   significant LVOT obstruction. No evidence of SAM.  Impressions:  - The LV outflow trract is very narrow but no significant LVOT   obstruction. No evidence of SAM.  MYOVIEW: 10/31/2018  The study is normal.  This is a low risk study.  The left ventricular ejection fraction is hyperdynamic (>65%).  Downsloping ST segment depression ST segment depression was noted during stress in the V4, V5, V6, aVL and I leads.   Patient did have some ST depression with pharmacologic lexiscan injection  However perfusion images were normal with no ischemia or infarct and hyperdynamic LV function EF 80%     Recent Labs: 05/24/2018: ALT 16; Magnesium 2.0 10/30/2018: BUN 15; Creatinine, Ser 0.95; Potassium 4.1; Sodium 139; TSH 3.174 10/31/2018: Hemoglobin 12.9; Platelets 79  CBC    Component Value Date/Time   WBC 5.3 10/31/2018 0551   RBC 4.33 10/31/2018 0551   HGB 12.9 10/31/2018 0551   HCT 40.7 10/31/2018 0551   PLT 79 (L) 10/31/2018 0551   MCV 94.0 10/31/2018 0551   MCH 29.8 10/31/2018 0551   MCHC 31.7 10/31/2018 0551   RDW 13.2 10/31/2018 0551   LYMPHSABS 0.9 05/24/2018 0853   MONOABS 0.4 05/24/2018 0853   EOSABS 0.1 05/24/2018 0853   BASOSABS 0.0 05/24/2018 0853   CMP Latest Ref Rng & Units 10/30/2018 05/25/2018 05/24/2018  Glucose 70 - 99 mg/dL 106(H) 98 110(H)  BUN 8 - 23 mg/dL 15 14 14   Creatinine 0.44 - 1.00 mg/dL 0.95 0.75 0.79  Sodium 135 - 145 mmol/L 139 138 139  Potassium 3.5 - 5.1 mmol/L 4.1 3.6 3.8  Chloride 98 - 111 mmol/L 102 108 107  CO2 22 - 32 mmol/L 26 25 27   Calcium 8.9 - 10.3 mg/dL 9.5 8.9 9.1  Total Protein  6.5 - 8.1 g/dL - - 6.6  Total Bilirubin 0.3 - 1.2 mg/dL - - 0.7  Alkaline Phos 38 - 126 U/L - - 59  AST 15 - 41 U/L - - 19  ALT 0 - 44 U/L - - 16     Lipid Panel No results found for: CHOL, HDL, LDLCALC, LDLDIRECT, TRIG, CHOLHDL    Wt Readings from Last 3 Encounters:  12/26/18 125 lb 9.6 oz (57 kg)  10/30/18 122 lb (55.3 kg)  10/08/18 123 lb (55.8 kg)     Other studies Reviewed: Additional studies/ records that were reviewed today include: Office notes, hospital records and testing.  ASSESSMENT AND PLAN:   1.  Chest pain: She is not having consistent symptoms with exertion and the perfusion on her Myoview was good.  The episodes only last a second, and have no associated symptoms or radiation. - I reassured her that this was very atypical for cardiac pain.  If she has any exertional symptoms such as chest pain or shortness of breath or if her symptoms get more frequent, she is to call us. -I reassured her that her stress test showed good perfusion. - ECG is abnormal at baseline, but no different than previous. -No further evaluation at this time, palpated tension to symptoms and report any changes  2.  Persistent atrial fibrillation - She is currently maintaining sinus rhythm. -She feels bad when she is in atrial fibrillation, she needs to maintain sinus rhythm - Continue anticoagulation with Eliquis, no bleeding issues - If she gets the general malaise that she had when she was in A. fib, she is to check her blood pressure and heart rate. -If her heart rate is 100 or so, she is to call us.  She is to call 911 if her heart rate is over 150  3.  CRF reduction: - Although her blood pressure is elevated today, her systolic blood pressure is generally in the 130s-140s at home.  No med  changes. - Her cholesterol is followed by her PCP and her LDL is less than 100 - She is encouraged to get regular exercise, 15-minute intervals are fine but try to get at least 30 minutes a day.    Current medicines are reviewed at length with the patient today.  The patient does not have concerns regarding medicines.  The following changes have been made:  no change  Labs/ tests ordered today include:   Orders Placed This Encounter  Procedures  . EKG 12-Lead     Disposition:   FU with Kate Sable, MD in a year  Signed, Rosaria Ferries, PA-C  12/26/2018 9:32 AM    [place] Phone: [phone]; Fax: [phone]

## 2018-12-26 ENCOUNTER — Other Ambulatory Visit: Payer: Self-pay

## 2018-12-26 ENCOUNTER — Encounter: Payer: Self-pay | Admitting: Physician Assistant

## 2018-12-26 ENCOUNTER — Ambulatory Visit (INDEPENDENT_AMBULATORY_CARE_PROVIDER_SITE_OTHER): Payer: Medicare Other | Admitting: Physician Assistant

## 2018-12-26 VITALS — BP 157/78 | HR 82 | Ht 64.0 in | Wt 125.6 lb

## 2018-12-26 DIAGNOSIS — R0789 Other chest pain: Secondary | ICD-10-CM | POA: Diagnosis not present

## 2018-12-26 DIAGNOSIS — I480 Paroxysmal atrial fibrillation: Secondary | ICD-10-CM

## 2018-12-26 NOTE — Patient Instructions (Addendum)
Medication Instructions:  Continue all current medications.  Labwork: none  Testing/Procedures: none  Follow-Up: Your physician wants you to follow up in:  1 year.  You will receive a reminder letter in the mail one-two months in advance.  If you don't receive a letter, please call our office to schedule the follow up appointment   Any Other Special Instructions Will Be Listed Below (If Applicable).   Please exercise at 15 minute intervals for 30 minutes per day.    If have more frequent chest pain that is prolonged, call to notify the office.  If feeling like having atrial fibrillation, check blood pressure & heart rate.   If heart rate greater than 150 - call 911.  If heart rate greater than 100 - call to notify the office.   If you need a refill on your cardiac medications before your next appointment, please call your pharmacy.

## 2018-12-27 ENCOUNTER — Other Ambulatory Visit: Payer: Self-pay | Admitting: Cardiovascular Disease

## 2019-05-27 ENCOUNTER — Telehealth: Payer: Self-pay | Admitting: *Deleted

## 2019-05-27 NOTE — Telephone Encounter (Signed)
Will fax this correspondence

## 2019-05-27 NOTE — Telephone Encounter (Signed)
Hold both doses the day before and hold the dose the morning of procedure.

## 2019-05-27 NOTE — Telephone Encounter (Signed)
Dr Luan Pulling office is needed to do tooth extractions on this pt (local anesthetic) and wanted to know if pt needed to hold eliquis prior to extractions and if so how long

## 2019-06-02 DIAGNOSIS — R42 Dizziness and giddiness: Secondary | ICD-10-CM | POA: Diagnosis not present

## 2019-06-02 DIAGNOSIS — R519 Headache, unspecified: Secondary | ICD-10-CM | POA: Diagnosis not present

## 2019-08-25 ENCOUNTER — Other Ambulatory Visit: Payer: Self-pay | Admitting: Cardiovascular Disease

## 2019-08-25 DIAGNOSIS — L570 Actinic keratosis: Secondary | ICD-10-CM | POA: Diagnosis not present

## 2019-09-19 DIAGNOSIS — R946 Abnormal results of thyroid function studies: Secondary | ICD-10-CM | POA: Diagnosis not present

## 2019-09-19 DIAGNOSIS — E782 Mixed hyperlipidemia: Secondary | ICD-10-CM | POA: Diagnosis not present

## 2019-09-19 DIAGNOSIS — Z13220 Encounter for screening for lipoid disorders: Secondary | ICD-10-CM | POA: Diagnosis not present

## 2019-09-19 DIAGNOSIS — I482 Chronic atrial fibrillation, unspecified: Secondary | ICD-10-CM | POA: Diagnosis not present

## 2019-09-19 DIAGNOSIS — Z9189 Other specified personal risk factors, not elsewhere classified: Secondary | ICD-10-CM | POA: Diagnosis not present

## 2019-09-23 DIAGNOSIS — K580 Irritable bowel syndrome with diarrhea: Secondary | ICD-10-CM | POA: Diagnosis not present

## 2019-09-23 DIAGNOSIS — Z0001 Encounter for general adult medical examination with abnormal findings: Secondary | ICD-10-CM | POA: Diagnosis not present

## 2019-09-23 DIAGNOSIS — J301 Allergic rhinitis due to pollen: Secondary | ICD-10-CM | POA: Diagnosis not present

## 2019-09-23 DIAGNOSIS — D696 Thrombocytopenia, unspecified: Secondary | ICD-10-CM | POA: Diagnosis not present

## 2019-12-17 DIAGNOSIS — H26493 Other secondary cataract, bilateral: Secondary | ICD-10-CM | POA: Diagnosis not present

## 2019-12-26 ENCOUNTER — Ambulatory Visit: Payer: Medicare Other | Admitting: Cardiovascular Disease

## 2019-12-26 ENCOUNTER — Ambulatory Visit: Payer: Medicare Other | Admitting: Family Medicine

## 2019-12-26 ENCOUNTER — Encounter: Payer: Self-pay | Admitting: Family Medicine

## 2019-12-26 VITALS — BP 150/70 | HR 68 | Ht 64.0 in | Wt 127.0 lb

## 2019-12-26 DIAGNOSIS — I10 Essential (primary) hypertension: Secondary | ICD-10-CM | POA: Diagnosis not present

## 2019-12-26 DIAGNOSIS — Z87898 Personal history of other specified conditions: Secondary | ICD-10-CM

## 2019-12-26 DIAGNOSIS — I4819 Other persistent atrial fibrillation: Secondary | ICD-10-CM | POA: Diagnosis not present

## 2019-12-26 NOTE — Patient Instructions (Signed)
Medication Instructions:  Your physician recommends that you continue on your current medications as directed. Please refer to the Current Medication list given to you today.  *If you need a refill on your cardiac medications before your next appointment, please call your pharmacy*   Lab Work: None If you have labs (blood work) drawn today and your tests are completely normal, you will receive your results only by: Marland Kitchen MyChart Message (if you have MyChart) OR . A paper copy in the mail If you have any lab test that is abnormal or we need to change your treatment, we will call you to review the results.   Testing/Procedures: None   Follow-Up: At [facility], you and your health needs are our priority.  As part of our continuing mission to provide you with exceptional heart care, we have created designated Provider Care Teams.  These Care Teams include your primary Cardiologist (physician) and Advanced Practice Providers (APPs -  Physician Assistants and Nurse Practitioners) who all work together to provide you with the care you need, when you need it.  We recommend signing up for the patient portal called "MyChart".  Sign up information is provided on this After Visit Summary.  MyChart is used to connect with patients for Virtual Visits (Telemedicine).  Patients are able to view lab/test results, encounter notes, upcoming appointments, etc.  Non-urgent messages can be sent to your provider as well.   To learn more about what you can do with MyChart, go to [email or web address].    Your next appointment:   12 month(s)  The format for your next appointment:   In Person  Provider:   You may see Dr Harl Bowie or the following Advanced Practice Provider on your designated Care Team:    Katina Dung, NP    Other Instructions None

## 2019-12-26 NOTE — Progress Notes (Signed)
Cardiology Office Note  Date: 12/26/2019   ID: PEREL HAUSCHILD, Alferd Apa 02-Jan-1933, MRN [id number]  PCP:  Caryl Bis, MD  Cardiologist:  Kate Sable, MD (Inactive) Electrophysiologist:  None   Chief Complaint: PAF  History of Present Illness: [name] is a 86 y.o. female with a history of PAF, chest pain, HTN, IBS, CHA2DS2-VASc equal 4 on Eliquis.  Admission 10/30/2018 with palpitations.  A. fib RVR, EKG with some ST depression.  On Cardizem and Eliquis.  Last office visit with Rosaria Ferries, PA 01/12/2019 having occasional left upper chest pain only lasting a second or 2.  Resolve spontaneously.  Not related to exertion or shortness of breath..  She had felt better on higher dose of Cardizem.  Blood pressure was generally well controlled.  Heart rate in the 50s at times.  No symptoms.  No palpitations with atrial fibrillation.  Generally felt bad instead of palpitations.  Had no PND or orthopnea, presyncope or syncope, or lower extremity edema.  She is here for 1 year follow-up.  She states she has occasional palpitations which are very short-lived lasting only a second or 2.  Also has some transient short-lived episodes of shortness of breath on occasion.  Otherwise she denies any anginal or exertional symptoms, palpitations or arrhythmias, orthostatic symptoms, lightheadedness, dizziness, presyncope or syncopal episodes.  Denies any CVA or TIA-like symptoms.  No PND or orthopnea, bleeding issues, claudication-like symptoms, DVT or PE-like symptoms, or lower extremity edema.  Her husband brings with him a log of her blood pressures and heart rates.  Blood pressures are all within normal limits less than 130s over 80s throughout.  Her EKG today shows normal sinus rhythm with possible left atrial enlargement ST and T wave abnormality rate of 68.   Past Medical History:  Diagnosis Date  . Atrial fibrillation ([place])   . Hypertension   . IBS (irritable bowel syndrome)   .  PONV (postoperative nausea and vomiting)     Past Surgical History:  Procedure Laterality Date  . CATARACT EXTRACTION W/PHACO Left 02/02/2017   Procedure: CATARACT EXTRACTION PHACO AND INTRAOCULAR LENS PLACEMENT (IOC);  Surgeon: Baruch Goldmann, MD;  Location: [facility];  Service: Ophthalmology;  Laterality: Left;  CDE: 14.49  . CATARACT EXTRACTION W/PHACO Right 03/02/2017   Procedure: CATARACT EXTRACTION PHACO AND INTRAOCULAR LENS PLACEMENT ([place]);  Surgeon: Baruch Goldmann, MD;  Location: [facility] OR[facility];  Service: Ophthalmology;  Laterality: Right;  CDE: 12.29  . COLONOSCOPY N/A 07/24/2014   Procedure: COLONOSCOPY;  Surgeon: Rogene Houston, MD;  Location: [facility] ENDO SU[facility];  Service: Endoscopy;  Laterality: N/A;  900  . HEMORRHOID SURGERY    . TONSILLECTOMY      Current Outpatient Medications  Medication Sig Dispense Refill  . ALPRAZolam (XANAX) 0.25 MG tablet Take 0.125 mg by mouth daily as needed for anxiety or sleep.     Marland Kitchen apixaban (ELIQUIS) 2.5 MG TABS tablet Take 1 tablet (2.5 mg total) by mouth 2 (two) times daily. 60 tablet 1  . carboxymethylcellulose (REFRESH PLUS) 0.5 % SOLN 1 drop 2 (two) times daily as needed.    . Cholecalciferol 1000 units capsule Take 1,000 Units by mouth daily.     . citalopram (CELEXA) 20 MG tablet Take 20 mg by mouth daily.     . clobetasol cream (TEMOVATE) 4.27 % Apply 1 application topically 2 (two) times daily as needed.    . diltiazem (CARDIZEM CD) 180 MG 24 hr capsule TAKE ONE CAPSULE BY MOUTH EVERY  DAY 90 capsule 1  . hydrocortisone (ANUSOL-HC) 2.5 % rectal cream Place 1 application rectally 2 (two) times daily as needed for hemorrhoids.     . hyoscyamine (LEVSIN SL) 0.125 MG SL tablet PLACE 1 TABLET UNDER TONGUE THREE TIMES DAILY AS NEEDED (Patient taking differently: PLACE 1 TABLET UNDER TONGUE THREE TIMES DAILY AS NEEDED FOR IBS) 90 tablet 2  . Lactobacillus-Inulin (CULTURELLE DIGESTIVE HEALTH PO) Take 1 tablet by mouth daily.    Marland Kitchen loratadine (CLARITIN) 10 MG  tablet Take 10 mg by mouth daily.    Marland Kitchen MISC NATURAL PRODUCTS PO Take 1 tablet by mouth daily. Hair Volume    . polyethylene glycol (MIRALAX) packet Take 17 g by mouth daily. 30 packet 1  . Propylene Glycol (SYSTANE BALANCE) 0.6 % SOLN Apply 1 drop to eye 2 (two) times a day.    . SF 5000 PLUS 1.1 % CREA dental cream USE IN PLACE OF REGULAR TOOTHPASTE, BRUSH BEFORE BEDTIME FOR TWO MINUTES, THEN EXPECTORATE. NO RINSING, EATING OR DRINKING FOR 30 MINUTES.  99  . simethicone (MYLICON) 80 MG chewable tablet Chew 80 mg by mouth every 6 (six) hours as needed for flatulence.    . vitamin B-12 (CYANOCOBALAMIN) 500 MCG tablet Take 1 tablet (500 mcg total) by mouth daily.     No current facility-administered medications for this visit.   Allergies:  Mercury   Social History: The patient  reports that she has never smoked. She has never used smokeless tobacco. She reports that she does not drink alcohol and does not use drugs.   Family History: The patient's family history includes Congestive Heart Failure in her mother.   ROS:  Please see the history of present illness. Otherwise, complete review of systems is positive for none.  All other systems are reviewed and negative.   Physical Exam: VS:  BP (!) 150/70   Pulse 68   Ht 5\' 4"  (1.626 m)   Wt 127 lb (57.6 kg)   SpO2 96%   BMI 21.80 kg/m , BMI Body mass index is 21.8 kg/m.  Wt Readings from Last 3 Encounters:  12/26/19 127 lb (57.6 kg)  12/26/18 125 lb 9.6 oz (57 kg)  10/30/18 122 lb (55.3 kg)    General: Patient appears comfortable at rest. Neck: Supple, no elevated JVP or carotid bruits, no thyromegaly. Lungs: Clear to auscultation, nonlabored breathing at rest. Cardiac: Regular rate and rhythm, no S3 or significant systolic murmur, no pericardial rub. Extremities: No pitting edema, distal pulses 2+. Skin: Warm and dry. Musculoskeletal: No kyphosis. Neuropsychiatric: Alert and oriented x3, affect grossly appropriate.  ECG:  An ECG  dated 12/26/2019 was personally reviewed today and demonstrated:  Normal sinus rhythm rate of 68, possible left atrial enlargement, ST and T wave abnormality, consider inferior ischemia.  Recent Labwork: No results found for requested labs within last 8760 hours.  No results found for: CHOL, TRIG, HDL, CHOLHDL, VLDL, LDLCALC, LDLDIRECT  Other Studies Reviewed Today:  ECHO: 05/25/2018 - Left ventricle: The cavity size was normal. Wall thickness was normal. Systolic function was normal. The estimated ejection fraction was in the range of 60% to 65%. Wall motion was normal; there were no regional wall motion abnormalities. Doppler parameters are consistent with abnormal left ventricular relaxation (grade 1 diastolic dysfunction). - Mitral valve: There was mild regurgitation. - Pulmonary arteries: PA peak pressure: 44 mm Hg (S). - Impressions: The LV outflow trract is very narrow but no significant LVOT obstruction. No evidence of SAM.  Impressions:  - The LV outflow trract is very narrow but no significant LVOT obstruction. No evidence of SAM.  MYOVIEW: 10/31/2018  The study is normal.  This is a low risk study.  The left ventricular ejection fraction is hyperdynamic (>65%).  Downsloping ST segment depression ST segment depression was noted during stress in the V4, V5, V6, aVL and I leads.  Patient did have some ST depression with pharmacologic lexiscan injection  However perfusion images were normal with no ischemia or infarct and hyperdynamic LV function EF 80%   Assessment and Plan:  1. History of chest pain   2. Persistent atrial fibrillation ([place])   3. Essential hypertension    1. History of chest pain Denies any recent anginal or exertional symptoms other than mild dyspnea on exertion which resolves quickly after resting.   2. Persistent atrial fibrillation ([place]) Patient admits to occasional transient palpitation lasting only a second or 2 which  may occur once a day.  She drinks decaf coffee and decaf tea she denies any other stimulant intake.  States she has occasional shortness of breath which may or may not be associated with palpitations.  Advised to restrict any stimulant intake to see if the palpitations episodes decrease or resolve.  Continue diltiazem CD 180 mg daily.  Eliquis 2.5 mg p.o. twice daily.  3. Essential hypertension She and her husband bring with them a log of blood pressures which are all less than 130 over 80s.  Blood pressure on arrival today was 150/70.    Medication Adjustments/Labs and Tests Ordered: Current medicines are reviewed at length with the patient today.  Concerns regarding medicines are outlined above.   Disposition: Follow-up with Dr. Domenic Polite or APP 1 year  Signed, Levell July, NP 12/26/2019 2:53 PM    [facility] [place], [place], [place] Phone: [phone]; Fax: [phone]

## 2020-02-20 ENCOUNTER — Other Ambulatory Visit: Payer: Self-pay | Admitting: *Deleted

## 2020-02-20 MED ORDER — DILTIAZEM HCL ER COATED BEADS 180 MG PO CP24: 180.0000 mg | ORAL_CAPSULE | Freq: Every day | ORAL | 1 refills | Status: DC

## 2020-03-18 DIAGNOSIS — I482 Chronic atrial fibrillation, unspecified: Secondary | ICD-10-CM | POA: Diagnosis not present

## 2020-03-18 DIAGNOSIS — Z13220 Encounter for screening for lipoid disorders: Secondary | ICD-10-CM | POA: Diagnosis not present

## 2020-03-18 DIAGNOSIS — R946 Abnormal results of thyroid function studies: Secondary | ICD-10-CM | POA: Diagnosis not present

## 2020-03-18 DIAGNOSIS — E755 Other lipid storage disorders: Secondary | ICD-10-CM | POA: Diagnosis not present

## 2020-03-18 DIAGNOSIS — Z9189 Other specified personal risk factors, not elsewhere classified: Secondary | ICD-10-CM | POA: Diagnosis not present

## 2020-03-23 DIAGNOSIS — D696 Thrombocytopenia, unspecified: Secondary | ICD-10-CM | POA: Diagnosis not present

## 2020-03-23 DIAGNOSIS — I482 Chronic atrial fibrillation, unspecified: Secondary | ICD-10-CM | POA: Diagnosis not present

## 2020-03-23 DIAGNOSIS — J301 Allergic rhinitis due to pollen: Secondary | ICD-10-CM | POA: Diagnosis not present

## 2020-03-23 DIAGNOSIS — K580 Irritable bowel syndrome with diarrhea: Secondary | ICD-10-CM | POA: Diagnosis not present

## 2020-03-23 DIAGNOSIS — Z6821 Body mass index (BMI) 21.0-21.9, adult: Secondary | ICD-10-CM | POA: Diagnosis not present

## 2020-03-30 DIAGNOSIS — Z23 Encounter for immunization: Secondary | ICD-10-CM | POA: Diagnosis not present

## 2020-05-11 DIAGNOSIS — Z1231 Encounter for screening mammogram for malignant neoplasm of breast: Secondary | ICD-10-CM | POA: Diagnosis not present

## 2020-05-11 DIAGNOSIS — Z803 Family history of malignant neoplasm of breast: Secondary | ICD-10-CM | POA: Diagnosis not present

## 2020-05-13 DIAGNOSIS — Z6821 Body mass index (BMI) 21.0-21.9, adult: Secondary | ICD-10-CM | POA: Diagnosis not present

## 2020-06-15 ENCOUNTER — Other Ambulatory Visit: Payer: Self-pay | Admitting: Family Medicine

## 2020-09-09 DIAGNOSIS — I482 Chronic atrial fibrillation, unspecified: Secondary | ICD-10-CM | POA: Diagnosis not present

## 2020-09-09 DIAGNOSIS — E559 Vitamin D deficiency, unspecified: Secondary | ICD-10-CM | POA: Diagnosis not present

## 2020-09-09 DIAGNOSIS — Z9189 Other specified personal risk factors, not elsewhere classified: Secondary | ICD-10-CM | POA: Diagnosis not present

## 2020-09-09 DIAGNOSIS — E782 Mixed hyperlipidemia: Secondary | ICD-10-CM | POA: Diagnosis not present

## 2020-09-09 DIAGNOSIS — Z0001 Encounter for general adult medical examination with abnormal findings: Secondary | ICD-10-CM | POA: Diagnosis not present

## 2020-09-09 DIAGNOSIS — Z13220 Encounter for screening for lipoid disorders: Secondary | ICD-10-CM | POA: Diagnosis not present

## 2020-09-09 DIAGNOSIS — D696 Thrombocytopenia, unspecified: Secondary | ICD-10-CM | POA: Diagnosis not present

## 2020-09-14 DIAGNOSIS — Z6820 Body mass index (BMI) 20.0-20.9, adult: Secondary | ICD-10-CM | POA: Diagnosis not present

## 2020-09-14 DIAGNOSIS — I482 Chronic atrial fibrillation, unspecified: Secondary | ICD-10-CM | POA: Diagnosis not present

## 2020-09-14 DIAGNOSIS — K580 Irritable bowel syndrome with diarrhea: Secondary | ICD-10-CM | POA: Diagnosis not present

## 2020-09-14 DIAGNOSIS — J301 Allergic rhinitis due to pollen: Secondary | ICD-10-CM | POA: Diagnosis not present

## 2020-09-14 DIAGNOSIS — D696 Thrombocytopenia, unspecified: Secondary | ICD-10-CM | POA: Diagnosis not present

## 2020-10-27 DIAGNOSIS — H26493 Other secondary cataract, bilateral: Secondary | ICD-10-CM | POA: Diagnosis not present

## 2020-11-26 DIAGNOSIS — H26493 Other secondary cataract, bilateral: Secondary | ICD-10-CM | POA: Diagnosis not present

## 2020-12-27 ENCOUNTER — Ambulatory Visit: Payer: Medicare Other | Admitting: Cardiology

## 2020-12-27 VITALS — BP 148/74 | HR 78 | Ht 64.5 in | Wt 120.0 lb

## 2020-12-27 DIAGNOSIS — I10 Essential (primary) hypertension: Secondary | ICD-10-CM

## 2020-12-27 DIAGNOSIS — I480 Paroxysmal atrial fibrillation: Secondary | ICD-10-CM | POA: Diagnosis not present

## 2020-12-27 NOTE — Progress Notes (Signed)
Clinical Summary [name] is a 87 y.o.female former patient of Dr Bronson Ing, this is our first visit together.  1.PAF - no recent palpitations - compliant with meds - no bleeding on eliquis.   2. HTN - home bp's 110s-120s/50-60s    3. Heart murmur  03/2019 echo LVEF 60-65%, narrow LVOT but no obstruction, mild MR Past Medical History:  Diagnosis Date   Atrial fibrillation (HCC)    Hypertension    IBS (irritable bowel syndrome)    PONV (postoperative nausea and vomiting)      Allergies  Allergen Reactions   Mercury Other (See Comments)    unknown     Current Outpatient Medications  Medication Sig Dispense Refill   ALPRAZolam (XANAX) 0.25 MG tablet Take 0.125 mg by mouth daily as needed for anxiety or sleep.      apixaban (ELIQUIS) 2.5 MG TABS tablet Take 1 tablet (2.5 mg total) by mouth 2 (two) times daily. 60 tablet 1   carboxymethylcellulose (REFRESH PLUS) 0.5 % SOLN 1 drop 2 (two) times daily as needed.     Cholecalciferol 1000 units capsule Take 1,000 Units by mouth daily.      citalopram (CELEXA) 20 MG tablet Take 20 mg by mouth daily.      clobetasol cream (TEMOVATE) [id number] % Apply 1 application topically 2 (two) times daily as needed.     diltiazem (CARDIZEM CD) 180 MG 24 hr capsule TAKE ONE CAPSULE BY MOUTH ONCE DAILY 90 capsule 1   hydrocortisone (ANUSOL-HC) 2.5 % rectal cream Place 1 application rectally 2 (two) times daily as needed for hemorrhoids.      hyoscyamine (LEVSIN SL) 0.125 MG SL tablet PLACE 1 TABLET UNDER TONGUE THREE TIMES DAILY AS NEEDED (Patient taking differently: PLACE 1 TABLET UNDER TONGUE THREE TIMES DAILY AS NEEDED FOR IBS) 90 tablet 2   Lactobacillus-Inulin (CULTURELLE DIGESTIVE HEALTH PO) Take 1 tablet by mouth daily.     loratadine (CLARITIN) 10 MG tablet Take 10 mg by mouth daily.     MISC NATURAL PRODUCTS PO Take 1 tablet by mouth daily. Hair Volume     polyethylene glycol (MIRALAX) packet Take 17 g by mouth daily. 30 packet 1    Propylene Glycol (SYSTANE BALANCE) 0.6 % SOLN Apply 1 drop to eye 2 (two) times a day.     SF 5000 PLUS 1.1 % CREA dental cream USE IN PLACE OF REGULAR TOOTHPASTE, BRUSH BEFORE BEDTIME FOR TWO MINUTES, THEN EXPECTORATE. NO RINSING, EATING OR DRINKING FOR 30 MINUTES.  99   simethicone (MYLICON) 80 MG chewable tablet Chew 80 mg by mouth every 6 (six) hours as needed for flatulence.     vitamin B-12 (CYANOCOBALAMIN) 500 MCG tablet Take 1 tablet (500 mcg total) by mouth daily.     No current facility-administered medications for this visit.     Past Surgical History:  Procedure Laterality Date   CATARACT EXTRACTION W/PHACO Left 02/02/2017   Procedure: CATARACT EXTRACTION PHACO AND INTRAOCULAR LENS PLACEMENT (IOC);  Surgeon: Baruch Goldmann, MD;  Location: [facility];  Service: Ophthalmology;  Laterality: Left;  CDE: 14.49   CATARACT EXTRACTION W/PHACO Right 03/02/2017   Procedure: CATARACT EXTRACTION PHACO AND INTRAOCULAR LENS PLACEMENT (IOC);  Surgeon: Baruch Goldmann, MD;  Location: [facility];  Service: Ophthalmology;  Laterality: Right;  CDE: 12.29   COLONOSCOPY N/A 07/24/2014   Procedure: COLONOSCOPY;  Surgeon: Rogene Houston, MD;  Location: [facility];  Service: Endoscopy;  Laterality: N/A;  [place]  TONSILLECTOMY       Allergies  Allergen Reactions   Mercury Other (See Comments)    unknown      Family History  Problem Relation Age of Onset   Congestive Heart Failure Mother      Social History [name] reports that she has never smoked. She has never used smokeless tobacco. [name] reports no history of alcohol use.   Review of Systems CONSTITUTIONAL: No weight loss, fever, chills, weakness or fatigue.  HEENT: Eyes: No visual loss, blurred vision, double vision or yellow sclerae.No hearing loss, sneezing, congestion, runny nose or sore throat.  SKIN: No rash or itching.  CARDIOVASCULAR: per hpi RESPIRATORY: No shortness of breath, cough or sputum.   GASTROINTESTINAL: No anorexia, nausea, vomiting or diarrhea. No abdominal pain or blood.  GENITOURINARY: No burning on urination, no polyuria NEUROLOGICAL: No headache, dizziness, syncope, paralysis, ataxia, numbness or tingling in the extremities. No change in bowel or bladder control.  MUSCULOSKELETAL: No muscle, back pain, joint pain or stiffness.  LYMPHATICS: No enlarged nodes. No history of splenectomy.  PSYCHIATRIC: No history of depression or anxiety.  ENDOCRINOLOGIC: No reports of sweating, cold or heat intolerance. No polyuria or polydipsia.  Marland Kitchen   Physical Examination Today's Vitals   12/27/20 1354  BP: (!) 148/74  Pulse: 78  Weight: 120 lb (54.4 kg)  Height: 5' 4.5" (1.638 m)   Body mass index is 20.28 kg/m.  Gen: resting comfortably, no acute distress HEENT: no scleral icterus, pupils equal round and reactive, no palptable cervical adenopathy,  CV: RRR, 3/6 systolic murmur rusb, no jvd Resp: Clear to auscultation bilaterally GI: abdomen is soft, non-tender, non-distended, normal bowel sounds, no hepatosplenomegaly MSK: extremities are warm, no edema.  Skin: warm, no rash Neuro:  no focal deficits Psych: appropriate affect   Diagnostic Studies  Echocardiogram 05/25/2018:   - Left ventricle: The cavity size was normal. Wall thickness was   normal. Systolic function was normal. The estimated ejection   fraction was in the range of 60% to 65%. Wall motion was normal;   there were no regional wall motion abnormalities. Doppler   parameters are consistent with abnormal left ventricular   relaxation (grade 1 diastolic dysfunction). - Mitral valve: There was mild regurgitation. - Pulmonary arteries: PA peak pressure: 44 mm Hg (S). - Impressions: The LV outflow trract is very narrow but no   significant LVOT obstruction. No evidence of SAM.   Impressions:   - The LV outflow trract is very narrow but no significant LVOT   obstruction. No evidence of  SAM.   Assessment and Plan   Paroxysmal Afib - no symptoms - EKG today shows NSR - continue current meds, low dose eliquis in patient over 80 that is 54 kg  2. HTN - elevated here but home numbers well within goal - continue current meds   Request labs from pcp F/u 1 year   Arnoldo Lenis, MD

## 2020-12-27 NOTE — Patient Instructions (Signed)

## 2020-12-28 ENCOUNTER — Encounter: Payer: Self-pay | Admitting: *Deleted

## 2020-12-30 DIAGNOSIS — R059 Cough, unspecified: Secondary | ICD-10-CM | POA: Diagnosis not present

## 2021-01-06 ENCOUNTER — Encounter (HOSPITAL_COMMUNITY): Payer: Self-pay

## 2021-01-06 ENCOUNTER — Emergency Department (HOSPITAL_COMMUNITY): Payer: Medicare Other

## 2021-01-06 ENCOUNTER — Other Ambulatory Visit: Payer: Self-pay

## 2021-01-06 ENCOUNTER — Emergency Department (HOSPITAL_COMMUNITY)
Admission: EM | Admit: 2021-01-06 | Discharge: 2021-01-06 | Disposition: A | Discharge status: Home / Self Care | Payer: Medicare Other | Attending: Emergency Medicine | Admitting: Emergency Medicine

## 2021-01-06 DIAGNOSIS — U071 COVID-19: Secondary | ICD-10-CM | POA: Insufficient documentation

## 2021-01-06 DIAGNOSIS — Z79899 Other long term (current) drug therapy: Secondary | ICD-10-CM | POA: Diagnosis not present

## 2021-01-06 DIAGNOSIS — I4891 Unspecified atrial fibrillation: Secondary | ICD-10-CM | POA: Diagnosis not present

## 2021-01-06 DIAGNOSIS — I517 Cardiomegaly: Secondary | ICD-10-CM | POA: Diagnosis not present

## 2021-01-06 DIAGNOSIS — Z7901 Long term (current) use of anticoagulants: Secondary | ICD-10-CM | POA: Insufficient documentation

## 2021-01-06 DIAGNOSIS — R5381 Other malaise: Secondary | ICD-10-CM | POA: Diagnosis not present

## 2021-01-06 DIAGNOSIS — R0602 Shortness of breath: Secondary | ICD-10-CM | POA: Diagnosis not present

## 2021-01-06 DIAGNOSIS — I10 Essential (primary) hypertension: Secondary | ICD-10-CM | POA: Insufficient documentation

## 2021-01-06 LAB — COMPREHENSIVE METABOLIC PANEL
ALT: 19 U/L (ref 0–44)
AST: 23 U/L (ref 15–41)
Albumin: 3.8 g/dL (ref 3.5–5.0)
Alkaline Phosphatase: 61 U/L (ref 38–126)
Anion gap: 8 (ref 5–15)
BUN: 24 mg/dL — ABNORMAL HIGH (ref 8–23)
CO2: 26 mmol/L (ref 22–32)
Calcium: 9 mg/dL (ref 8.9–10.3)
Chloride: 105 mmol/L (ref 98–111)
Creatinine, Ser: 0.77 mg/dL (ref 0.44–1.00)
GFR, Estimated: >60 mL/min (ref >60)
Glucose, Bld: 98 mg/dL (ref 70–99)
Potassium: 3.8 mmol/L (ref 3.5–5.1)
Sodium: 139 mmol/L (ref 135–145)
Total Bilirubin: 0.8 mg/dL (ref 0.3–1.2)
Total Protein: 7 g/dL (ref 6.5–8.1)

## 2021-01-06 LAB — CBC WITH DIFFERENTIAL/PLATELET
Abs Immature Granulocytes: 0.01 10*3/uL (ref 0.00–0.07)
Basophils Absolute: 0 10*3/uL (ref 0.0–0.1)
Basophils Relative: 0 %
Eosinophils Absolute: 0 10*3/uL (ref 0.0–0.5)
Eosinophils Relative: 0 %
HCT: 40.9 % (ref 36.0–46.0)
Hemoglobin: 12.8 g/dL (ref 12.0–15.0)
Immature Granulocytes: 0 %
Lymphocytes Relative: 14 %
Lymphs Abs: 0.8 10*3/uL (ref 0.7–4.0)
MCH: 30 pg (ref 26.0–34.0)
MCHC: 31.3 g/dL (ref 30.0–36.0)
MCV: 96 fL (ref 80.0–100.0)
Monocytes Absolute: 0.5 10*3/uL (ref 0.1–1.0)
Monocytes Relative: 9 %
Neutro Abs: 4.4 10*3/uL (ref 1.7–7.7)
Neutrophils Relative %: 77 %
Platelets: 155 10*3/uL (ref 150–400)
RBC: 4.26 MIL/uL (ref 3.87–5.11)
RDW: 13 % (ref 11.5–15.5)
WBC: 5.8 10*3/uL (ref 4.0–10.5)
nRBC: 0 % (ref 0.0–0.2)

## 2021-01-06 LAB — URINALYSIS, ROUTINE W REFLEX MICROSCOPIC
Bacteria, UA: NONE SEEN
Bilirubin Urine: NEGATIVE
Glucose, UA: NEGATIVE mg/dL
Ketones, ur: 5 mg/dL — AB
Leukocytes,Ua: NEGATIVE
Nitrite: NEGATIVE
Protein, ur: NEGATIVE mg/dL
Specific Gravity, Urine: 1.024 (ref 1.005–1.030)
pH: 5 (ref 5.0–8.0)

## 2021-01-06 NOTE — ED Triage Notes (Signed)
COVID dx 8/18 has been taking medication for it and is now SOB and not wanting to eat.  Pt alert and oriented skin warm and dry.  100% RA

## 2021-01-06 NOTE — ED Notes (Signed)
Provider at bedside discussing results and plan for discharge with patient.  Family made aware of plan for discharge home.

## 2021-01-06 NOTE — Discharge Instructions (Addendum)
Please continue to monitor symptoms closely.  If you develop any new or worsening symptoms please come back to the emergency department immediately for reevaluation.  Please make sure you follow-up with your regular doctor as well.  It was a pleasure to meet you.

## 2021-01-06 NOTE — ED Provider Notes (Signed)
[facility] Provider Note   CSN: [id number] Arrival date & time: 01/06/21  [id number]     History No chief complaint on file.   [name] is a 87 y.o. female.  HPI Patient is an 87 year old female with a history of atrial fibrillation anticoagulated on Eliquis, hypertension, who presents to the emergency department due to malaise.  Patient states she was diagnosed with COVID-19 on August 18.  She began experiencing symptoms the same day.  She has been vaccinated for COVID-19 x4.  She states that she was started on Molnupiravir for 5 days which she finished 3 days ago.  Since finishing this medication she states that overall she has felt better but last night began experiencing some mild dyspnea with exertion as well as generalized malaise.  Denies any nausea, vomiting, diarrhea but does note a decreased appetite.  No chest pain or chest tightness.  No abdominal pain.  She states that she has been compliant with all of her regular medications.    Past Medical History:  Diagnosis Date   Atrial fibrillation ([place])    Hypertension    IBS (irritable bowel syndrome)    PONV (postoperative nausea and vomiting)     Patient Active Problem List   Diagnosis Date Noted   Palpitations    Chest pain 10/30/2018   AF (paroxysmal atrial fibrillation) ([place]) 10/30/2018   Elevated troponin 10/30/2018   Atrial fibrillation with RVR ([place]) 05/24/2018   IBS (irritable bowel syndrome) 07/09/2014    Past Surgical History:  Procedure Laterality Date   CATARACT EXTRACTION W/PHACO Left 02/02/2017   Procedure: CATARACT EXTRACTION PHACO AND INTRAOCULAR LENS PLACEMENT ([place]);  Surgeon: Baruch Goldmann, MD;  Location: [facility];  Service: Ophthalmology;  Laterality: Left;  CDE: 14.49   CATARACT EXTRACTION W/PHACO Right 03/02/2017   Procedure: CATARACT EXTRACTION PHACO AND INTRAOCULAR LENS PLACEMENT (IOC);  Surgeon: Baruch Goldmann, MD;  Location: [facility];  Service: Ophthalmology;  Laterality:  Right;  CDE: 12.29   COLONOSCOPY N/A 07/24/2014   Procedure: COLONOSCOPY;  Surgeon: Rogene Houston, MD;  Location: [facility];  Service: Endoscopy;  Laterality: N/A;  900   HEMORRHOID SURGERY     TONSILLECTOMY       OB History   No obstetric history on file.     Family History  Problem Relation Age of Onset   Congestive Heart Failure Mother     Social History   Tobacco Use   Smoking status: Never   Smokeless tobacco: Never  Vaping Use   Vaping Use: Never used  Substance Use Topics   Alcohol use: No    Alcohol/week: 0.0 standard drinks   Drug use: No    Home Medications Prior to Admission medications   Medication Sig Start Date End Date Taking? Authorizing Provider  ALPRAZolam (XANAX) 0.25 MG tablet Take 0.125 mg by mouth daily as needed for anxiety or sleep.     [provider]  apixaban (ELIQUIS) 2.5 MG TABS tablet Take 1 tablet (2.5 mg total) by mouth 2 (two) times daily. 05/25/18   Caren Griffins, MD  carboxymethylcellulose (REFRESH PLUS) 0.5 % SOLN 1 drop 2 (two) times daily as needed.    [provider]  Cholecalciferol 1000 units capsule Take 1,000 Units by mouth daily.     [provider]  citalopram (CELEXA) 20 MG tablet Take 20 mg by mouth daily.     [provider]  clobetasol cream (TEMOVATE) [id number] % Apply 1 application topically 2 (two)  times daily as needed. 10/05/18   [provider]  diltiazem (CARDIZEM CD) 180 MG 24 hr capsule TAKE ONE CAPSULE BY MOUTH ONCE DAILY 06/15/20   Verta Ellen., NP  hydrocortisone (ANUSOL-HC) 2.5 % rectal cream Place 1 application rectally 2 (two) times daily as needed for hemorrhoids.     [provider]  hyoscyamine (LEVSIN SL) 0.125 MG SL tablet PLACE 1 TABLET UNDER TONGUE THREE TIMES DAILY AS NEEDED 04/18/13   Rehman, Mechele Dawley, MD  Lactobacillus-Inulin (CULTURELLE DIGESTIVE HEALTH PO) Take 1 tablet by mouth daily.    [provider]  loratadine (CLARITIN) 10  MG tablet Take 10 mg by mouth daily.    [provider]  MISC NATURAL PRODUCTS PO Take 1 tablet by mouth daily. Hair Volume    [provider]  Propylene Glycol (SYSTANE BALANCE) 0.6 % SOLN Apply 1 drop to eye 2 (two) times a day.    [provider]  SF 5000 PLUS 1.1 % CREA dental cream USE IN PLACE OF REGULAR TOOTHPASTE, BRUSH BEFORE BEDTIME FOR TWO MINUTES, THEN EXPECTORATE. NO RINSING, EATING OR DRINKING FOR 30 MINUTES. 12/23/16   [provider]  vitamin B-12 (CYANOCOBALAMIN) 500 MCG tablet Take 1 tablet (500 mcg total) by mouth daily. 10/31/18   Orson Eva, MD    Allergies    Mercury  Review of Systems   Review of Systems  All other systems reviewed and are negative. Ten systems reviewed and are negative for acute change, except as noted in the HPI.   Physical Exam Updated Vital Signs BP 131/77   Pulse 62   Temp 98.2 F (36.8 C) (Tympanic)   Resp 19   Ht '5\' 4"'$  (1.626 m)   Wt 54 kg   SpO2 99%   BMI 20.43 kg/m   Physical Exam Vitals and nursing note reviewed.  Constitutional:      General: She is not in acute distress.    Appearance: Normal appearance. She is not ill-appearing, toxic-appearing or diaphoretic.  HENT:     Head: Normocephalic and atraumatic.     Right Ear: External ear normal.     Left Ear: External ear normal.     Nose: Nose normal.     Mouth/Throat:     Mouth: Mucous membranes are moist.     Pharynx: Oropharynx is clear. No oropharyngeal exudate or posterior oropharyngeal erythema.  Eyes:     General: No scleral icterus.       Right eye: No discharge.        Left eye: No discharge.     Extraocular Movements: Extraocular movements intact.     Conjunctiva/sclera: Conjunctivae normal.  Cardiovascular:     Rate and Rhythm: Normal rate and regular rhythm.     Pulses: Normal pulses.     Heart sounds: Normal heart sounds. No murmur heard.   No friction rub. No gallop.     Comments: RRR without M/R/G. Pulmonary:      Effort: Pulmonary effort is normal. No respiratory distress.     Breath sounds: Normal breath sounds. No stridor. No wheezing, rhonchi or rales.     Comments: LCTAB without wheezing, rales or rhonchi.  Abdominal:     General: Abdomen is flat.     Palpations: Abdomen is soft.     Tenderness: There is no abdominal tenderness.     Comments: Abdomen is flat, soft and non tender.  Musculoskeletal:        General: Normal range of motion.  Cervical back: Normal range of motion and neck supple. No tenderness.     Right lower leg: No edema.     Left lower leg: No edema.  Skin:    General: Skin is warm and dry.  Neurological:     General: No focal deficit present.     Mental Status: She is alert and oriented to person, place, and time.  Psychiatric:        Mood and Affect: Mood normal.        Behavior: Behavior normal.    ED Results / Procedures / Treatments   Labs (all labs ordered are listed, but only abnormal results are displayed) Labs Reviewed  COMPREHENSIVE METABOLIC PANEL - Abnormal; Notable for the following components:      Result Value   BUN 24 (*)    All other components within normal limits  URINALYSIS, ROUTINE W REFLEX MICROSCOPIC - Abnormal; Notable for the following components:   APPearance HAZY (*)    Hgb urine dipstick MODERATE (*)    Ketones, ur 5 (*)    All other components within normal limits  CBC WITH DIFFERENTIAL/PLATELET    EKG EKG Interpretation  Date/Time:  Thursday January 06 2021 10:13:28 EDT Ventricular Rate:  62 PR Interval:  142 QRS Duration: 97 QT Interval:  432 QTC Calculation: 439 R Axis:   79 Text Interpretation: Sinus rhythm Borderline repolarization abnormality Confirmed by Milton Ferguson [phone]) on 01/06/2021 1:21:17 PM  Radiology DG Chest Portable 1 View  Result Date: 01/06/2021 CLINICAL DATA:  87 year old female positive COVID-19. Shortness of breath. EXAM: PORTABLE CHEST 1 VIEW COMPARISON:  Chest radiographs 10/30/2018 and earlier.  FINDINGS: Portable AP upright view at 0955 hours. Chronically large lung volumes. Chronic diffuse increased pulmonary interstitial opacity. Stable cardiomegaly. Calcified aortic atherosclerosis. No evidence of acute pulmonary embolus. Other mediastinal contours are within normal limits. Visualized tracheal air column is within normal limits. No pneumothorax, pulmonary edema, pleural effusion or confluent pulmonary opacity. No acute osseous abnormality identified. IMPRESSION: No acute cardiopulmonary abnormality. Chronic lung disease with hyperinflation. Cardiomegaly. Electronically Signed   By: Genevie Ann M.D.   On: 01/06/2021 10:22    Procedures Procedures   Medications Ordered in ED Medications - No data to display  ED Course  I have reviewed the triage vital signs and the nursing notes.  Pertinent labs & imaging results that were available during my care of the patient were reviewed by me and considered in my medical decision making (see chart for details).    MDM Rules/Calculators/A&P                          Pt is a 87 y.o. female who presents to the emergency department due to malaise for the past few days since being diagnosed with COVID-19, 1 week ago.  Labs: CBC without abnormalities. CMP with a BUN of 24. UA with moderate hemoglobin as well as 5 ketones.  Imaging: Chest x-ray shows no acute cardiopulmonary abnormalities.  Chronic lung disease with hyperinflation.  Cardiomegaly.  I, Rayna Sexton, PA-C, personally reviewed and evaluated these images and lab results as part of my medical decision-making.  Patient appears to be experiencing persistent malaise/fatigue since being diagnosed with COVID-19 1 week ago.  She was vaccinated for COVID-19 x4 and was started on Molnupiravir for 5 days which she completed 3 days ago.  She reported some mild shortness of breath with exertion so we obtained basic lab work as well as an  ECG and chest x-ray.  Lab work is all reassuring.  No  leukocytosis.  Chest x-ray shows no acute cardiopulmonary abnormalities.  ECG shows is sinus rhythm with borderline repolarization abnormality.   Patient is ambulatory with a steady gait.  She has had no episodes of hypoxia since arrival.  She is afebrile and nontachycardic.  Feel that the patient is stable for discharge at this time and she is agreeable.  Given very strict return precautions and she knows to return to the emergency department if she develops any new or worsening symptoms.  She states her son is going to drive her home.  Her questions were answered and she was amicable at the time of discharge.  Patient discussed with and evaluated by my attending physician Dr. Milton Ferguson who was in agreement with the above plan.  Note: Portions of this report may have been transcribed using voice recognition software. Every effort was made to ensure accuracy; however, inadvertent computerized transcription errors may be present.   Final Clinical Impression(s) / ED Diagnoses Final diagnoses:  [id number]  Malaise   Rx / DC Orders ED Discharge Orders     None        Rayna Sexton, Hershal Coria 01/06/21 2115    Milton Ferguson, MD 01/07/21 [phone]

## 2021-01-13 IMAGING — DX CHEST - 2 VIEW
2 series · 2 of 2 positions shown · non-contrast
Comparison: May 24, 2018

CLINICAL DATA: Diarrhea. Tachycardia. History of atrial
fibrillation.

EXAM:
CHEST - 2 VIEW

[chest pa]
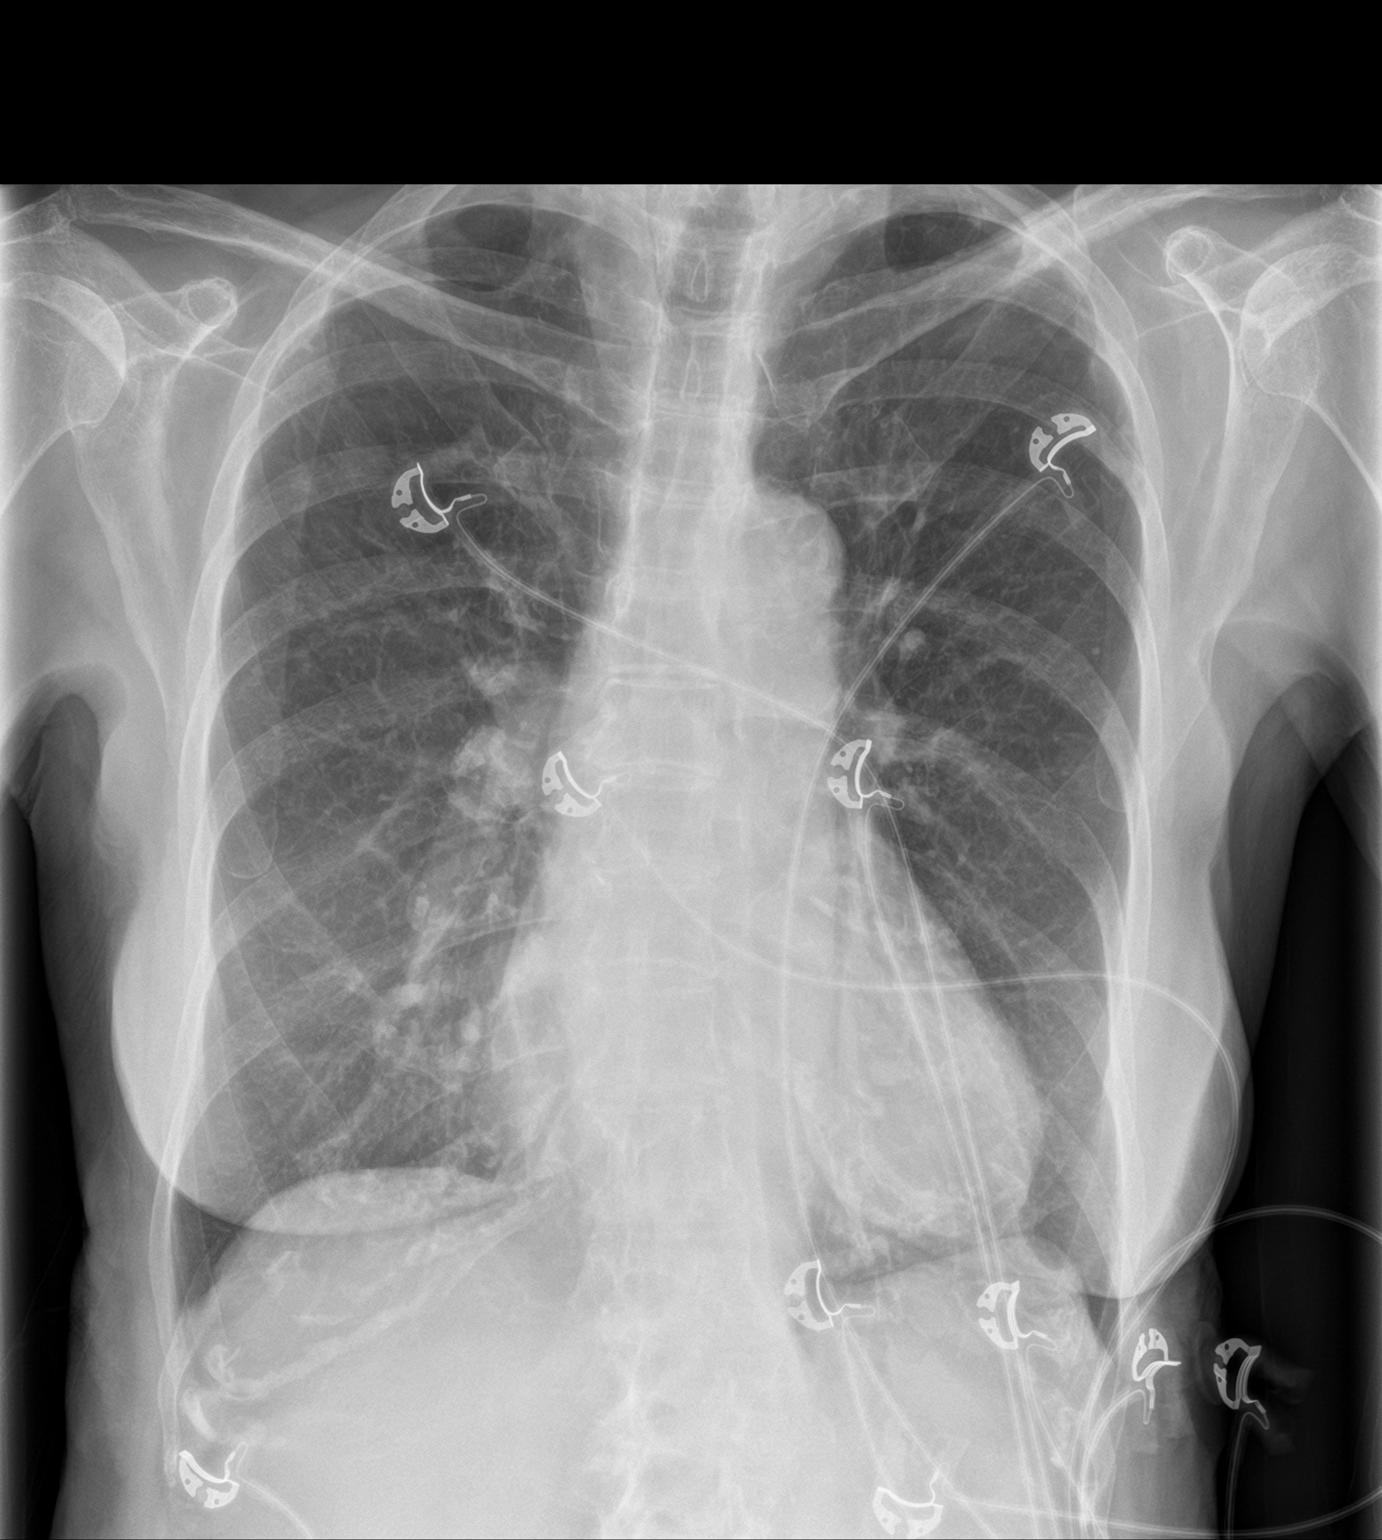

[chest lat]
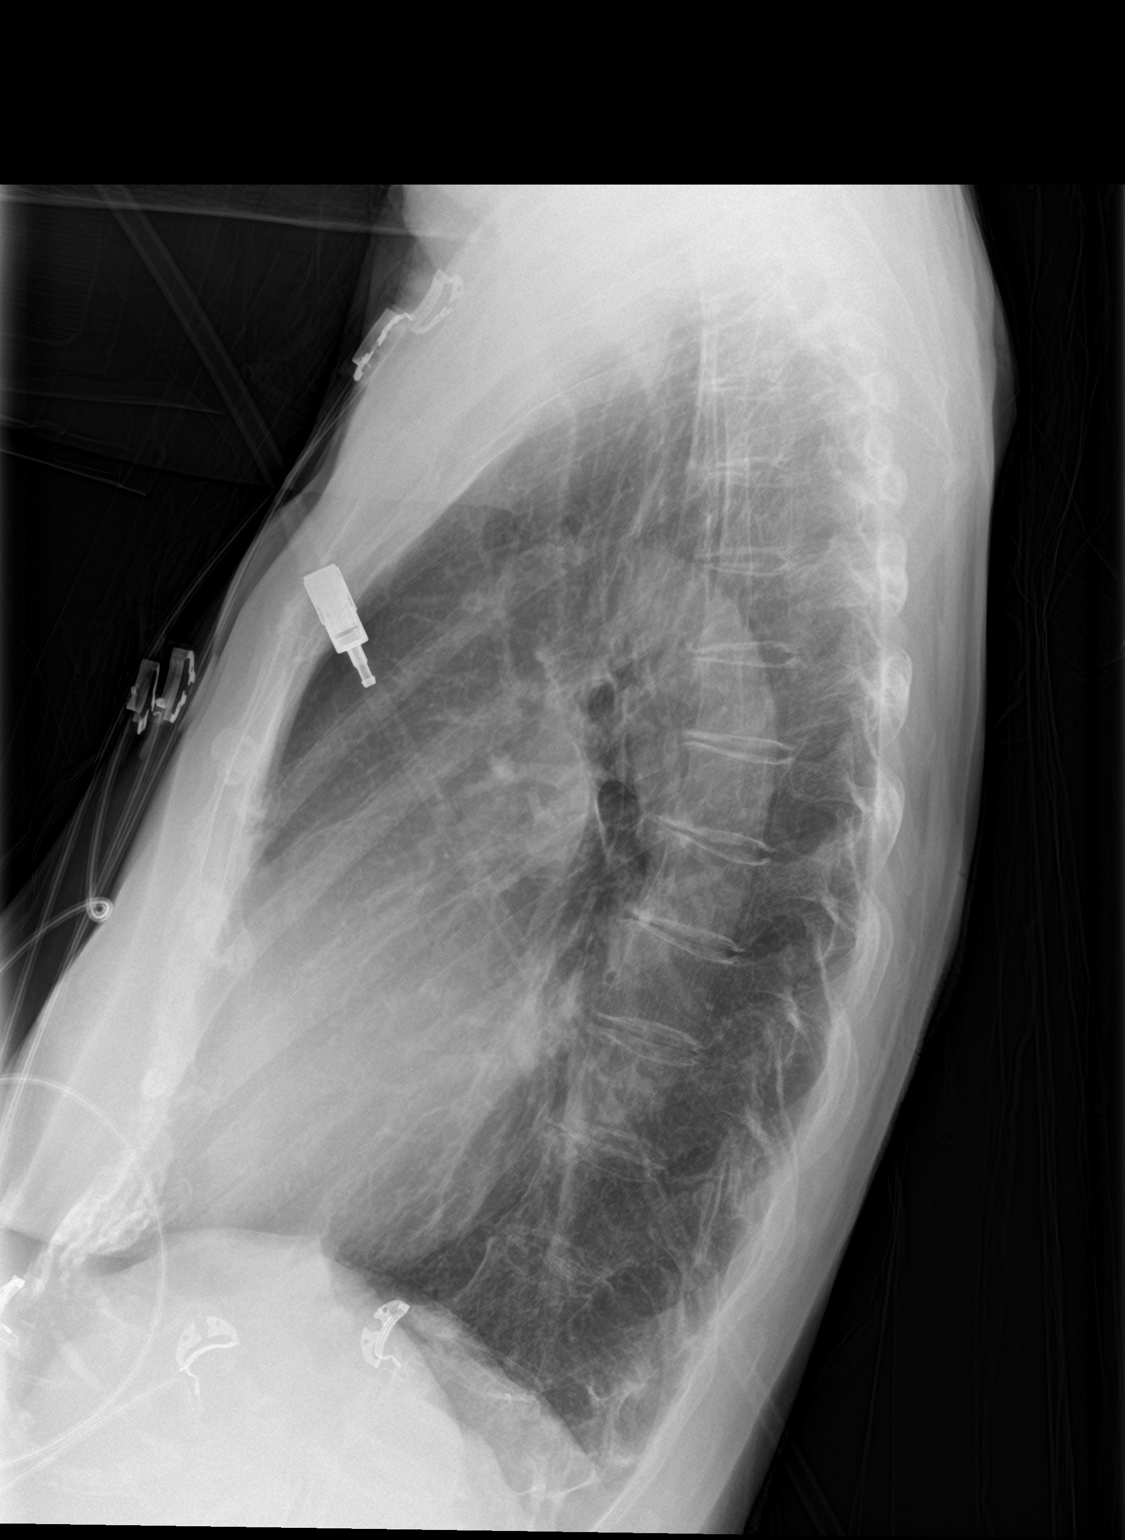

[2 of 2 positions shown; findings below may reference images not displayed]

FINDINGS: No pneumothorax. The heart, hila, and mediastinum are normal. No
pulmonary nodules or masses. No focal infiltrates. No overt edema.
No other acute abnormalities.
IMPRESSION: No active cardiopulmonary disease.

## 2021-02-03 ENCOUNTER — Other Ambulatory Visit: Payer: Self-pay | Admitting: Family Medicine

## 2021-02-05 DIAGNOSIS — Z23 Encounter for immunization: Secondary | ICD-10-CM | POA: Diagnosis not present

## 2021-03-11 DIAGNOSIS — E785 Hyperlipidemia, unspecified: Secondary | ICD-10-CM | POA: Diagnosis not present

## 2021-03-11 DIAGNOSIS — Z13220 Encounter for screening for lipoid disorders: Secondary | ICD-10-CM | POA: Diagnosis not present

## 2021-03-11 DIAGNOSIS — R946 Abnormal results of thyroid function studies: Secondary | ICD-10-CM | POA: Diagnosis not present

## 2021-03-11 DIAGNOSIS — D696 Thrombocytopenia, unspecified: Secondary | ICD-10-CM | POA: Diagnosis not present

## 2021-03-11 DIAGNOSIS — I482 Chronic atrial fibrillation, unspecified: Secondary | ICD-10-CM | POA: Diagnosis not present

## 2021-03-11 DIAGNOSIS — Z6820 Body mass index (BMI) 20.0-20.9, adult: Secondary | ICD-10-CM | POA: Diagnosis not present

## 2021-03-11 DIAGNOSIS — Z0001 Encounter for general adult medical examination with abnormal findings: Secondary | ICD-10-CM | POA: Diagnosis not present

## 2021-03-11 DIAGNOSIS — F17210 Nicotine dependence, cigarettes, uncomplicated: Secondary | ICD-10-CM | POA: Diagnosis not present

## 2021-03-16 DIAGNOSIS — J301 Allergic rhinitis due to pollen: Secondary | ICD-10-CM | POA: Diagnosis not present

## 2021-03-16 DIAGNOSIS — Z23 Encounter for immunization: Secondary | ICD-10-CM | POA: Diagnosis not present

## 2021-03-16 DIAGNOSIS — Z0001 Encounter for general adult medical examination with abnormal findings: Secondary | ICD-10-CM | POA: Diagnosis not present

## 2021-03-16 DIAGNOSIS — D696 Thrombocytopenia, unspecified: Secondary | ICD-10-CM | POA: Diagnosis not present

## 2021-03-16 DIAGNOSIS — Z6820 Body mass index (BMI) 20.0-20.9, adult: Secondary | ICD-10-CM | POA: Diagnosis not present

## 2021-03-16 DIAGNOSIS — K580 Irritable bowel syndrome with diarrhea: Secondary | ICD-10-CM | POA: Diagnosis not present

## 2021-03-16 DIAGNOSIS — I482 Chronic atrial fibrillation, unspecified: Secondary | ICD-10-CM | POA: Diagnosis not present

## 2021-03-29 ENCOUNTER — Telehealth: Payer: Self-pay | Admitting: Cardiology

## 2021-03-29 ENCOUNTER — Ambulatory Visit: Payer: Medicare Other | Admitting: Cardiology

## 2021-03-29 ENCOUNTER — Encounter: Payer: Self-pay | Admitting: Cardiology

## 2021-03-29 VITALS — BP 148/62 | HR 72 | Ht 64.5 in | Wt 121.6 lb

## 2021-03-29 DIAGNOSIS — I480 Paroxysmal atrial fibrillation: Secondary | ICD-10-CM | POA: Diagnosis not present

## 2021-03-29 DIAGNOSIS — I10 Essential (primary) hypertension: Secondary | ICD-10-CM

## 2021-03-29 DIAGNOSIS — R0602 Shortness of breath: Secondary | ICD-10-CM | POA: Diagnosis not present

## 2021-03-29 NOTE — Telephone Encounter (Signed)
Checking percert on the following patient for testing scheduled at [facility].      ECHO    04/04/2021

## 2021-03-29 NOTE — Addendum Note (Signed)
Addended by: Levonne Hubert on: 03/29/2021 10:07 AM   Modules accepted: Orders

## 2021-03-29 NOTE — Progress Notes (Signed)
Clinical Summary [name] is a 87 y.o.female seen today for follow up of the following medical problems.   1.PAF   - occasional palpitations, overall rare  - compliant withmeds, no bleeding on eliquis   2. SOB - comes on with activity. Variable in frequency.  - DOE with activities. No new LE edema. No cough, no wheezing - no chest pain    3. HTN - home bp's 120s-130s/50s HRs 60s       4. Heart murmur  03/2019 echo LVEF 60-65%, narrow LVOT but no obstruction, mild MR Past Medical History:  Diagnosis Date   Atrial fibrillation (HCC)    Hypertension    IBS (irritable bowel syndrome)    PONV (postoperative nausea and vomiting)      Allergies  Allergen Reactions   Mercury Other (See Comments)    unknown     Current Outpatient Medications  Medication Sig Dispense Refill   ALPRAZolam (XANAX) 0.25 MG tablet Take 0.125 mg by mouth daily as needed for anxiety or sleep.      apixaban (ELIQUIS) 2.5 MG TABS tablet Take 1 tablet (2.5 mg total) by mouth 2 (two) times daily. 60 tablet 1   carboxymethylcellulose (REFRESH PLUS) 0.5 % SOLN 1 drop 2 (two) times daily as needed.     Cholecalciferol 1000 units capsule Take 1,000 Units by mouth daily.      citalopram (CELEXA) 20 MG tablet Take 20 mg by mouth daily.      clobetasol cream (TEMOVATE) 1.61 % Apply 1 application topically 2 (two) times daily as needed.     diltiazem (CARDIZEM CD) 180 MG 24 hr capsule TAKE ONE CAPSULE BY MOUTH ONCE DAILY 90 capsule 1   hydrocortisone (ANUSOL-HC) 2.5 % rectal cream Place 1 application rectally 2 (two) times daily as needed for hemorrhoids.      hyoscyamine (LEVSIN SL) 0.125 MG SL tablet PLACE 1 TABLET UNDER TONGUE THREE TIMES DAILY AS NEEDED 90 tablet 2   Lactobacillus-Inulin (CULTURELLE DIGESTIVE HEALTH PO) Take 1 tablet by mouth daily.     loratadine (CLARITIN) 10 MG tablet Take 10 mg by mouth daily.     MISC NATURAL PRODUCTS PO Take 1 tablet by mouth daily. Hair Volume      Propylene Glycol (SYSTANE BALANCE) 0.6 % SOLN Apply 1 drop to eye 2 (two) times a day.     SF 5000 PLUS 1.1 % CREA dental cream USE IN PLACE OF REGULAR TOOTHPASTE, BRUSH BEFORE BEDTIME FOR TWO MINUTES, THEN EXPECTORATE. NO RINSING, EATING OR DRINKING FOR 30 MINUTES.  99   vitamin B-12 (CYANOCOBALAMIN) 500 MCG tablet Take 1 tablet (500 mcg total) by mouth daily.     No current facility-administered medications for this visit.     Past Surgical History:  Procedure Laterality Date   CATARACT EXTRACTION W/PHACO Left 02/02/2017   Procedure: CATARACT EXTRACTION PHACO AND INTRAOCULAR LENS PLACEMENT (IOC);  Surgeon: Baruch Goldmann, MD;  Location: [facility];  Service: Ophthalmology;  Laterality: Left;  CDE: 14.49   CATARACT EXTRACTION W/PHACO Right 03/02/2017   Procedure: CATARACT EXTRACTION PHACO AND INTRAOCULAR LENS PLACEMENT (IOC);  Surgeon: Baruch Goldmann, MD;  Location: [facility];  Service: Ophthalmology;  Laterality: Right;  CDE: 12.29   COLONOSCOPY N/A 07/24/2014   Procedure: COLONOSCOPY;  Surgeon: Rogene Houston, MD;  Location: [facility];  Service: Endoscopy;  Laterality: N/A;  900   HEMORRHOID SURGERY     TONSILLECTOMY       Allergies  Allergen Reactions  Mercury Other (See Comments)    unknown      Family History  Problem Relation Age of Onset   Congestive Heart Failure Mother      Social History [name] reports that she has never smoked. She has never used smokeless tobacco. [name] reports no history of alcohol use.   Review of Systems CONSTITUTIONAL: No weight loss, fever, chills, weakness or fatigue.  HEENT: Eyes: No visual loss, blurred vision, double vision or yellow sclerae.No hearing loss, sneezing, congestion, runny nose or sore throat.  SKIN: No rash or itching.  CARDIOVASCULAR: per hpi RESPIRATORY: per hpi GASTROINTESTINAL: No anorexia, nausea, vomiting or diarrhea. No abdominal pain or blood.  GENITOURINARY: No burning on urination, no  polyuria NEUROLOGICAL: No headache, dizziness, syncope, paralysis, ataxia, numbness or tingling in the extremities. No change in bowel or bladder control.  MUSCULOSKELETAL: No muscle, back pain, joint pain or stiffness.  LYMPHATICS: No enlarged nodes. No history of splenectomy.  PSYCHIATRIC: No history of depression or anxiety.  ENDOCRINOLOGIC: No reports of sweating, cold or heat intolerance. No polyuria or polydipsia.  Marland Kitchen   Physical Examination Today's Vitals   03/29/21 0923  BP: (!) 148/62  Pulse: 72  SpO2: 97%  Weight: 121 lb 9.6 oz (55.2 kg)  Height: 5' 4.5" (1.638 m)   Body mass index is 20.55 kg/m.  Gen: resting comfortably, no acute distress HEENT: no scleral icterus, pupils equal round and reactive, no palptable cervical adenopathy,  CV: RRR, 2/6 systolic murmur, rusb.  Resp: Clear to auscultation bilaterally GI: abdomen is soft, non-tender, non-distended, normal bowel sounds, no hepatosplenomegaly MSK: extremities are warm, no edema.  Skin: warm, no rash Neuro:  no focal deficits Psych: appropriate affect   Diagnostic Studies Echocardiogram 05/25/2018:   - Left ventricle: The cavity size was normal. Wall thickness was   normal. Systolic function was normal. The estimated ejection   fraction was in the range of 60% to 65%. Wall motion was normal;   there were no regional wall motion abnormalities. Doppler   parameters are consistent with abnormal left ventricular   relaxation (grade 1 diastolic dysfunction). - Mitral valve: There was mild regurgitation. - Pulmonary arteries: PA peak pressure: 44 mm Hg (S). - Impressions: The LV outflow trract is very narrow but no   significant LVOT obstruction. No evidence of SAM.   Impressions:   - The LV outflow trract is very narrow but no significant LVOT   obstruction. No evidence of SAM.    Assessment and Plan  Paroxysmal Afib -overall doing well, continue current meds - EKG today looks to be ectopic atrial rhythm  with short PR. ST/T changes are chronic (see 12/27/20 ekg)  2. SOB - unclear etiology, will obtain echo - home vitals shows HRs stable 60s every day, I think afib less likely a cause.  - prior stress test was benign, in absence of chest pain would not repeat at this time   3. HTN - elevated here but home numbers well within goal, which is her normal pattern - she will continue current meds.       Arnoldo Lenis, M.D.

## 2021-03-29 NOTE — Patient Instructions (Signed)

## 2021-04-04 ENCOUNTER — Ambulatory Visit (HOSPITAL_COMMUNITY)
Admission: RE | Admit: 2021-04-04 | Discharge: 2021-04-04 | Disposition: A | Discharge status: Home / Self Care | Payer: Medicare Other | Source: Ambulatory Visit | Attending: Cardiology | Admitting: Cardiology

## 2021-04-04 DIAGNOSIS — R0602 Shortness of breath: Secondary | ICD-10-CM

## 2021-04-04 LAB — ECHOCARDIOGRAM COMPLETE
AR max vel: 2.24 cm2
AV Area VTI: 2.36 cm2
AV Area mean vel: 2.21 cm2
AV Mean grad: 3 mmHg
AV Peak grad: 7.2 mmHg
Ao pk vel: 1.34 m/s
Area-P 1/2: 3.45 cm2
Calc EF: 59.4 %
MV VTI: 2.5 cm2
S' Lateral: 2.5 cm
Single Plane A2C EF: 51.7 %
Single Plane A4C EF: 64.4 %

## 2021-04-04 NOTE — Progress Notes (Signed)
*  PRELIMINARY RESULTS* Echocardiogram 2D Echocardiogram has been performed.  [name] 04/04/2021, 1:26 PM

## 2021-04-05 ENCOUNTER — Telehealth: Payer: Self-pay | Admitting: Cardiology

## 2021-04-05 NOTE — Telephone Encounter (Signed)
Pt reaching out for echo test results... please advise

## 2021-04-06 NOTE — Telephone Encounter (Signed)
Left a message for patient to call office back for results.  

## 2021-04-06 NOTE — Telephone Encounter (Signed)
Returned call to pt with echo results

## 2021-04-06 NOTE — Telephone Encounter (Signed)
Pt is returning call for Echo results  [phone]

## 2021-05-19 DIAGNOSIS — Z6820 Body mass index (BMI) 20.0-20.9, adult: Secondary | ICD-10-CM | POA: Diagnosis not present

## 2021-05-24 DIAGNOSIS — Z1231 Encounter for screening mammogram for malignant neoplasm of breast: Secondary | ICD-10-CM | POA: Diagnosis not present

## 2021-06-03 DIAGNOSIS — R922 Inconclusive mammogram: Secondary | ICD-10-CM | POA: Diagnosis not present

## 2021-06-03 DIAGNOSIS — R928 Other abnormal and inconclusive findings on diagnostic imaging of breast: Secondary | ICD-10-CM | POA: Diagnosis not present

## 2021-06-16 ENCOUNTER — Other Ambulatory Visit: Payer: Self-pay | Admitting: Radiology

## 2021-06-16 DIAGNOSIS — N6032 Fibrosclerosis of left breast: Secondary | ICD-10-CM | POA: Diagnosis not present

## 2021-06-16 DIAGNOSIS — N6322 Unspecified lump in the left breast, upper inner quadrant: Secondary | ICD-10-CM | POA: Diagnosis not present

## 2021-06-16 DIAGNOSIS — R928 Other abnormal and inconclusive findings on diagnostic imaging of breast: Secondary | ICD-10-CM | POA: Diagnosis not present

## 2021-07-13 DIAGNOSIS — L820 Inflamed seborrheic keratosis: Secondary | ICD-10-CM | POA: Diagnosis not present

## 2021-07-13 DIAGNOSIS — L821 Other seborrheic keratosis: Secondary | ICD-10-CM | POA: Diagnosis not present

## 2021-07-13 DIAGNOSIS — L570 Actinic keratosis: Secondary | ICD-10-CM | POA: Diagnosis not present

## 2021-07-13 DIAGNOSIS — D485 Neoplasm of uncertain behavior of skin: Secondary | ICD-10-CM | POA: Diagnosis not present

## 2021-07-18 DIAGNOSIS — J329 Chronic sinusitis, unspecified: Secondary | ICD-10-CM | POA: Diagnosis not present

## 2021-07-18 DIAGNOSIS — Z6821 Body mass index (BMI) 21.0-21.9, adult: Secondary | ICD-10-CM | POA: Diagnosis not present

## 2021-07-21 DIAGNOSIS — R059 Cough, unspecified: Secondary | ICD-10-CM | POA: Diagnosis not present

## 2021-07-21 DIAGNOSIS — Z6821 Body mass index (BMI) 21.0-21.9, adult: Secondary | ICD-10-CM | POA: Diagnosis not present

## 2021-07-21 DIAGNOSIS — J45909 Unspecified asthma, uncomplicated: Secondary | ICD-10-CM | POA: Diagnosis not present

## 2021-07-22 ENCOUNTER — Other Ambulatory Visit: Payer: Self-pay

## 2021-07-22 ENCOUNTER — Emergency Department (HOSPITAL_COMMUNITY)
Admission: EM | Admit: 2021-07-22 | Discharge: 2021-07-23 | Disposition: A | Discharge status: Home / Self Care | Payer: Medicare Other | Attending: Emergency Medicine | Admitting: Emergency Medicine

## 2021-07-22 ENCOUNTER — Emergency Department (HOSPITAL_COMMUNITY): Payer: Medicare Other

## 2021-07-22 ENCOUNTER — Encounter (HOSPITAL_COMMUNITY): Payer: Self-pay

## 2021-07-22 DIAGNOSIS — R059 Cough, unspecified: Secondary | ICD-10-CM | POA: Diagnosis not present

## 2021-07-22 DIAGNOSIS — J3489 Other specified disorders of nose and nasal sinuses: Secondary | ICD-10-CM | POA: Diagnosis not present

## 2021-07-22 DIAGNOSIS — I10 Essential (primary) hypertension: Secondary | ICD-10-CM | POA: Diagnosis not present

## 2021-07-22 DIAGNOSIS — J029 Acute pharyngitis, unspecified: Secondary | ICD-10-CM | POA: Insufficient documentation

## 2021-07-22 DIAGNOSIS — J479 Bronchiectasis, uncomplicated: Secondary | ICD-10-CM | POA: Diagnosis not present

## 2021-07-22 DIAGNOSIS — R051 Acute cough: Secondary | ICD-10-CM

## 2021-07-22 DIAGNOSIS — R509 Fever, unspecified: Secondary | ICD-10-CM | POA: Insufficient documentation

## 2021-07-22 DIAGNOSIS — R062 Wheezing: Secondary | ICD-10-CM | POA: Insufficient documentation

## 2021-07-22 DIAGNOSIS — I4891 Unspecified atrial fibrillation: Secondary | ICD-10-CM | POA: Diagnosis not present

## 2021-07-22 DIAGNOSIS — R0602 Shortness of breath: Secondary | ICD-10-CM | POA: Diagnosis not present

## 2021-07-22 DIAGNOSIS — J439 Emphysema, unspecified: Secondary | ICD-10-CM | POA: Diagnosis not present

## 2021-07-22 DIAGNOSIS — J45909 Unspecified asthma, uncomplicated: Secondary | ICD-10-CM | POA: Diagnosis not present

## 2021-07-22 MED ORDER — IPRATROPIUM-ALBUTEROL 0.5-2.5 (3) MG/3ML IN SOLN: 3.0000 mL | RESPIRATORY_TRACT | Status: DC

## 2021-07-22 MED ORDER — BENZONATATE 100 MG PO CAPS
100.0000 mg | ORAL_CAPSULE | Freq: Once | ORAL | Status: AC
  Administered 2021-07-22: 100 mg via ORAL
  Filled 2021-07-22: qty 1

## 2021-07-22 MED ORDER — IPRATROPIUM BROMIDE 0.02 % IN SOLN
1.0000 mg | Freq: Once | RESPIRATORY_TRACT | Status: AC
  Administered 2021-07-22: 1 mg via RESPIRATORY_TRACT
  Filled 2021-07-22: qty 5

## 2021-07-22 MED ORDER — LEVALBUTEROL HCL 1.25 MG/0.5ML IN NEBU
1.2500 mg | INHALATION_SOLUTION | Freq: Once | RESPIRATORY_TRACT | Status: AC
  Administered 2021-07-22: 1.25 mg via RESPIRATORY_TRACT
  Filled 2021-07-22: qty 0.5

## 2021-07-22 NOTE — ED Triage Notes (Addendum)
Pt presents with cough that started earlier this week. Pt seen by PCP twice this week for same. Pt states she was treated for asthmatic bronchitis and was given antibiotics. Pt states there has not been any improvement and that she can not stop coughing.  ?

## 2021-07-22 NOTE — ED Provider Notes (Signed)
?[name] ?Provider Note ? ? ?CSN: [id number] ?Arrival date & time: 07/22/21  2138 ? ?  ? ?History ? ?Chief Complaint  ?Patient presents with  ? Cough  ? ? ?[name] is a 88 y.o. female. ? ?Seen by pcp and started on antibiotics. Also has had two shots of unknown medication in PCP office.  ? ? ?Cough ?Cough characteristics:  Non-productive ?Severity:  Mild ?Duration:  4 days ?Timing:  Intermittent ?Progression:  Unchanged ?Chronicity:  New ?Smoker: no   ?Relieved by:  None tried ?Worsened by:  Nothing ?Ineffective treatments:  None tried ?Associated symptoms: fever, rhinorrhea, shortness of breath, sinus congestion, sore throat and wheezing   ?Associated symptoms: no chest pain   ? ?  ? ?Home Medications ?Prior to Admission medications   ?Medication Sig Start Date End Date Taking? Authorizing Provider  ?benzonatate (TESSALON) 100 MG capsule Take 1 capsule (100 mg total) by mouth 3 (three) times daily as needed for cough. 07/23/21  Yes Aariyana Manz, Corene Cornea, MD  ?furosemide (LASIX) 20 MG tablet Take 1 tablet (20 mg total) by mouth daily for 3 days. 07/23/21 07/26/21 Yes Amulya Quintin, Corene Cornea, MD  ?predniSONE (DELTASONE) 20 MG tablet 3 tabs po day one, then 2 tabs daily x 4 days 07/23/21  Yes Kouper Spinella, Corene Cornea, MD  ?ALPRAZolam Duanne Moron) 0.25 MG tablet Take 0.125 mg by mouth daily as needed for anxiety or sleep.     [provider]  ?apixaban (ELIQUIS) 2.5 MG TABS tablet Take 1 tablet (2.5 mg total) by mouth 2 (two) times daily. 05/25/18   Caren Griffins, MD  ?carboxymethylcellulose (REFRESH PLUS) 0.5 % SOLN 1 drop 2 (two) times daily as needed.    [provider]  ?Cholecalciferol 1000 units capsule Take 1,000 Units by mouth daily.     [provider]  ?citalopram (CELEXA) 20 MG tablet Take 20 mg by mouth daily.     [provider]  ?clobetasol cream (TEMOVATE) 2.02 % Apply 1 application topically 2 (two) times daily as needed. 10/05/18   [provider]  ?diltiazem  (CARDIZEM CD) 180 MG 24 hr capsule TAKE ONE CAPSULE BY MOUTH ONCE DAILY 02/03/21   Verta Ellen., NP  ?hydrocortisone (ANUSOL-HC) 2.5 % rectal cream Place 1 application rectally 2 (two) times daily as needed for hemorrhoids.     [provider]  ?hyoscyamine (LEVSIN SL) 0.125 MG SL tablet PLACE 1 TABLET UNDER TONGUE THREE TIMES DAILY AS NEEDED 04/18/13   Rehman, Mechele Dawley, MD  ?Lactobacillus-Inulin (Braxton PO) Take 1 tablet by mouth daily.    [provider]  ?loratadine (CLARITIN) 10 MG tablet Take 10 mg by mouth daily.    [provider]  ?MISC NATURAL PRODUCTS PO Take 1 tablet by mouth daily. Hair Volume    [provider]  ?Propylene Glycol (SYSTANE BALANCE) 0.6 % SOLN Apply 1 drop to eye 2 (two) times a day. ?Patient not taking: Reported on 03/29/2021    [provider]  ?SF 5000 PLUS 1.1 % CREA dental cream USE IN PLACE OF REGULAR TOOTHPASTE, BRUSH BEFORE BEDTIME FOR TWO MINUTES, THEN EXPECTORATE. NO RINSING, EATING OR DRINKING FOR 30 MINUTES. 12/23/16   [provider]  ?vitamin B-12 (CYANOCOBALAMIN) 500 MCG tablet Take 1 tablet (500 mcg total) by mouth daily. 10/31/18   Orson Eva, MD  ?   ? ?Allergies    ?Mercury   ? ?Review of Systems   ?Review of Systems  ?Constitutional:  Positive for  fever.  ?HENT:  Positive for rhinorrhea and sore throat.   ?Respiratory:  Positive for cough, shortness of breath and wheezing.   ?Cardiovascular:  Negative for chest pain.  ? ?Physical Exam ?Updated Vital Signs ?BP (!) 147/66   Pulse 70   Temp 98.1 ?F (36.7 ?C) (Oral)   Resp (!) 22   Ht '5\' 5"'$  (1.651 m)   Wt 54.4 kg   SpO2 95%   BMI 19.97 kg/m?  ?Physical Exam ?Vitals and nursing note reviewed.  ?Constitutional:   ?   Appearance: [name] is well-developed.  ?HENT:  ?   Head: Normocephalic and atraumatic.  ?   Mouth/Throat:  ?   Mouth: Mucous membranes are moist.  ?   Pharynx: Oropharynx is clear.  ?Eyes:  ?   Pupils: Pupils are equal, round, and  reactive to light.  ?Cardiovascular:  ?   Rate and Rhythm: Normal rate and regular rhythm.  ?Pulmonary:  ?   Effort: No respiratory distress.  ?   Breath sounds: No stridor. Wheezing present.  ?Abdominal:  ?   General: Abdomen is flat. There is no distension.  ?Musculoskeletal:     ?   General: No swelling or tenderness. Normal range of motion.  ?   Cervical back: Normal range of motion.  ?Skin: ?   General: Skin is warm and dry.  ?Neurological:  ?   General: No focal deficit present.  ?   Mental Status: [name] is alert.  ? ? ?ED Results / Procedures / Treatments   ?Labs ?(all labs ordered are listed, but only abnormal results are displayed) ?Labs Reviewed  ?CBC WITH DIFFERENTIAL/PLATELET - Abnormal; Notable for the following components:  ?    Result Value  ? Platelets 63 (*)   ? All other components within normal limits  ?COMPREHENSIVE METABOLIC PANEL - Abnormal; Notable for the following components:  ? Glucose, Bld 118 (*)   ? All other components within normal limits  ?BRAIN NATRIURETIC PEPTIDE - Abnormal; Notable for the following components:  ? B Natriuretic Peptide 235.0 (*)   ? All other components within normal limits  ?TROPONIN I (HIGH SENSITIVITY) - Abnormal; Notable for the following components:  ? Troponin I (High Sensitivity) 18 (*)   ? All other components within normal limits  ? ? ?EKG ?None ? ?Radiology ?DG Chest Portable 1 View ? ?Result Date: 07/23/2021 ?CLINICAL DATA:  Cough and shortness of breath. EXAM: PORTABLE CHEST 1 VIEW COMPARISON:  07/21/2021 FINDINGS: Heart size and pulmonary vascularity are normal. Emphysematous changes in the lungs. Central interstitial changes with bronchiectasis consistent with chronic bronchitis. No airspace disease or consolidation in the lungs. No pleural effusions. No pneumothorax. Mediastinal contours appear intact. Calcification of the aorta. Degenerative changes in the spine and shoulders. IMPRESSION: Emphysematous and chronic bronchitic changes in the lungs. No  focal consolidation. Electronically Signed   By: Lucienne Capers M.D.   On: 07/23/2021 00:21   ? ?Procedures ?Procedures  ? ? ?Medications Ordered in ED ?Medications  ?aerochamber plus with mask device 1 each (1 each Other Not Given 07/23/21 0157)  ?benzonatate (TESSALON) capsule 100 mg (100 mg Oral Given 07/22/21 2337)  ?levalbuterol (XOPENEX) nebulizer solution 1.25 mg (1.25 mg Nebulization Given 07/22/21 2353)  ?ipratropium (ATROVENT) nebulizer solution 1 mg (1 mg Nebulization Given 07/22/21 2353)  ?furosemide (LASIX) tablet 20 mg (20 mg Oral Given 07/23/21 0053)  ?albuterol (VENTOLIN HFA) 108 (90 Base) MCG/ACT inhaler 2 puff (2 puffs Inhalation Given 07/23/21 0156)  ? ? ?ED Course/  Medical Decision Making/ A&P ?  ?                        ?Medical Decision Making ?Amount and/or Complexity of Data Reviewed ?Labs: ordered. ?Radiology: ordered. ?ECG/medicine tests: ordered. ? ?Risk ?Prescription drug management. ? ? ?Here with wheezing. H/o Afib, so bnp/trop checked which were slightly elevated. Had improvement here with xopenex and lasix. Will d/c on same for a few days. Rx for steroids given. No cp to suspect ACS. Recent echo with possible pulm htn but otherwise reassuring, no need for repeat. Not hypoxic. Not not in distress. Will fu w/ PCP in a few days for recheck. No indication for another round of antibiotics at this time.  ? ? ?Final Clinical Impression(s) / ED Diagnoses ?Final diagnoses:  ?Acute cough  ? ? ?Rx / DC Orders ?ED Discharge Orders   ? ?      Ordered  ?  predniSONE (DELTASONE) 20 MG tablet       ? 07/23/21 0143  ?  furosemide (LASIX) 20 MG tablet  Daily       ? 07/23/21 0143  ?  benzonatate (TESSALON) 100 MG capsule  3 times daily PRN       ? 07/23/21 0143  ? ?  ?  ? ?  ? ? ?  ?Merrily Pew, MD ?07/23/21 0201 ? ?

## 2021-07-23 DIAGNOSIS — J479 Bronchiectasis, uncomplicated: Secondary | ICD-10-CM | POA: Diagnosis not present

## 2021-07-23 DIAGNOSIS — J439 Emphysema, unspecified: Secondary | ICD-10-CM | POA: Diagnosis not present

## 2021-07-23 DIAGNOSIS — R0602 Shortness of breath: Secondary | ICD-10-CM | POA: Diagnosis not present

## 2021-07-23 DIAGNOSIS — R059 Cough, unspecified: Secondary | ICD-10-CM | POA: Diagnosis not present

## 2021-07-23 LAB — CBC WITH DIFFERENTIAL/PLATELET
Abs Immature Granulocytes: 0.01 10*3/uL (ref 0.00–0.07)
Basophils Absolute: 0 10*3/uL (ref 0.0–0.1)
Basophils Relative: 1 %
Eosinophils Absolute: 0.1 10*3/uL (ref 0.0–0.5)
Eosinophils Relative: 1 %
HCT: 41.5 % (ref 36.0–46.0)
Hemoglobin: 13.3 g/dL (ref 12.0–15.0)
Immature Granulocytes: 0 %
Lymphocytes Relative: 15 %
Lymphs Abs: 0.7 10*3/uL (ref 0.7–4.0)
MCH: 30.8 pg (ref 26.0–34.0)
MCHC: 32 g/dL (ref 30.0–36.0)
MCV: 96.1 fL (ref 80.0–100.0)
Monocytes Absolute: 0.6 10*3/uL (ref 0.1–1.0)
Monocytes Relative: 13 %
Neutro Abs: 3.2 10*3/uL (ref 1.7–7.7)
Neutrophils Relative %: 70 %
Platelets: 63 10*3/uL — ABNORMAL LOW (ref 150–400)
RBC: 4.32 MIL/uL (ref 3.87–5.11)
RDW: 13.6 % (ref 11.5–15.5)
WBC: 4.6 10*3/uL (ref 4.0–10.5)
nRBC: 0 % (ref 0.0–0.2)

## 2021-07-23 LAB — COMPREHENSIVE METABOLIC PANEL
ALT: 18 U/L (ref 0–44)
AST: 24 U/L (ref 15–41)
Albumin: 3.6 g/dL (ref 3.5–5.0)
Alkaline Phosphatase: 67 U/L (ref 38–126)
Anion gap: 9 (ref 5–15)
BUN: 19 mg/dL (ref 8–23)
CO2: 27 mmol/L (ref 22–32)
Calcium: 9.3 mg/dL (ref 8.9–10.3)
Chloride: 101 mmol/L (ref 98–111)
Creatinine, Ser: 0.8 mg/dL (ref 0.44–1.00)
GFR, Estimated: >60 mL/min (ref >60)
Glucose, Bld: 118 mg/dL — ABNORMAL HIGH (ref 70–99)
Potassium: 3.6 mmol/L (ref 3.5–5.1)
Sodium: 137 mmol/L (ref 135–145)
Total Bilirubin: 0.4 mg/dL (ref 0.3–1.2)
Total Protein: 7.3 g/dL (ref 6.5–8.1)

## 2021-07-23 LAB — TROPONIN I (HIGH SENSITIVITY): Troponin I (High Sensitivity): 18 ng/L — ABNORMAL HIGH (ref <18)

## 2021-07-23 LAB — BRAIN NATRIURETIC PEPTIDE: B Natriuretic Peptide: 235 pg/mL — ABNORMAL HIGH (ref 0.0–100.0)

## 2021-07-23 MED ORDER — FUROSEMIDE 40 MG PO TABS
20.0000 mg | ORAL_TABLET | Freq: Once | ORAL | Status: AC
  Administered 2021-07-23: 20 mg via ORAL
  Filled 2021-07-23: qty 1

## 2021-07-23 MED ORDER — FUROSEMIDE 20 MG PO TABS: 20.0000 mg | ORAL_TABLET | Freq: Every day | ORAL | 0 refills | Status: DC

## 2021-07-23 MED ORDER — ALBUTEROL SULFATE HFA 108 (90 BASE) MCG/ACT IN AERS
2.0000 | INHALATION_SPRAY | Freq: Once | RESPIRATORY_TRACT | Status: AC
  Administered 2021-07-23: 2 via RESPIRATORY_TRACT
  Filled 2021-07-23: qty 6.7

## 2021-07-23 MED ORDER — BENZONATATE 100 MG PO CAPS: 100.0000 mg | ORAL_CAPSULE | Freq: Three times a day (TID) | ORAL | 0 refills | Status: DC | PRN

## 2021-07-23 MED ORDER — AEROCHAMBER PLUS FLO-VU MISC
1.0000 | Freq: Once | Status: DC
Start: 2021-07-23 — End: 2021-07-23
  Filled 2021-07-23: qty 1

## 2021-07-23 MED ORDER — PREDNISONE 20 MG PO TABS
ORAL_TABLET | ORAL | 0 refills | Status: AC
Start: 2021-07-23 — End: ?

## 2021-07-27 DIAGNOSIS — Z6821 Body mass index (BMI) 21.0-21.9, adult: Secondary | ICD-10-CM | POA: Diagnosis not present

## 2021-07-27 DIAGNOSIS — J45909 Unspecified asthma, uncomplicated: Secondary | ICD-10-CM | POA: Diagnosis not present

## 2021-07-29 DIAGNOSIS — Z6821 Body mass index (BMI) 21.0-21.9, adult: Secondary | ICD-10-CM | POA: Diagnosis not present

## 2021-07-29 DIAGNOSIS — J45909 Unspecified asthma, uncomplicated: Secondary | ICD-10-CM | POA: Diagnosis not present

## 2021-08-24 DIAGNOSIS — R0789 Other chest pain: Secondary | ICD-10-CM | POA: Diagnosis not present

## 2021-08-24 DIAGNOSIS — Z6821 Body mass index (BMI) 21.0-21.9, adult: Secondary | ICD-10-CM | POA: Diagnosis not present

## 2021-09-06 ENCOUNTER — Other Ambulatory Visit: Payer: Self-pay | Admitting: *Deleted

## 2021-09-06 MED ORDER — DILTIAZEM HCL ER COATED BEADS 180 MG PO CP24: 180.0000 mg | ORAL_CAPSULE | Freq: Every day | ORAL | 1 refills | Status: DC

## 2021-09-20 DIAGNOSIS — Z6821 Body mass index (BMI) 21.0-21.9, adult: Secondary | ICD-10-CM | POA: Diagnosis not present

## 2021-09-20 DIAGNOSIS — D696 Thrombocytopenia, unspecified: Secondary | ICD-10-CM | POA: Diagnosis not present

## 2021-09-20 DIAGNOSIS — K580 Irritable bowel syndrome with diarrhea: Secondary | ICD-10-CM | POA: Diagnosis not present

## 2021-09-20 DIAGNOSIS — J301 Allergic rhinitis due to pollen: Secondary | ICD-10-CM | POA: Diagnosis not present

## 2021-09-20 DIAGNOSIS — I482 Chronic atrial fibrillation, unspecified: Secondary | ICD-10-CM | POA: Diagnosis not present

## 2021-09-20 DIAGNOSIS — Z23 Encounter for immunization: Secondary | ICD-10-CM | POA: Diagnosis not present

## 2021-10-06 ENCOUNTER — Encounter: Payer: Self-pay | Admitting: Cardiology

## 2021-10-06 ENCOUNTER — Ambulatory Visit (INDEPENDENT_AMBULATORY_CARE_PROVIDER_SITE_OTHER): Payer: Medicare Other | Admitting: Cardiology

## 2021-10-06 VITALS — BP 140/76 | HR 86 | Ht 64.5 in | Wt 122.6 lb

## 2021-10-06 DIAGNOSIS — I480 Paroxysmal atrial fibrillation: Secondary | ICD-10-CM

## 2021-10-06 DIAGNOSIS — I10 Essential (primary) hypertension: Secondary | ICD-10-CM | POA: Diagnosis not present

## 2021-10-06 NOTE — Progress Notes (Signed)
Clinical Summary [name] is a 88 y.o.female seen today for follow up of the following medical problems.    1.PAF  - occasional palpitations, overall rare  - compliant withmeds, no bleeding on eliquis  - no recent palpitaiotns - compliant with meds. No bleeding on eliquis.      2. SOB 03/2021 echo: LVEF 65-70%, no WMAs, indet diastolic, elevated LA pressure, normal RV, mild MR - breathing has improved since last visit. She reports treatment for bronchitis.    3. HTN - home bp's 120s-130s/50s HRs 60s   - home bp's 120s/50s-60s, HRs 60s.      4. Heart murmur  03/2019 echo LVEF 60-65%, narrow LVOT but no obstruction, mild MR      Past Medical History:  Diagnosis Date   Atrial fibrillation (HCC)    Hypertension    IBS (irritable bowel syndrome)    PONV (postoperative nausea and vomiting)      Allergies  Allergen Reactions   Mercury Other (See Comments)    unknown     Current Outpatient Medications  Medication Sig Dispense Refill   ALPRAZolam (XANAX) 0.25 MG tablet Take 0.125 mg by mouth daily as needed for anxiety or sleep.      apixaban (ELIQUIS) 2.5 MG TABS tablet Take 1 tablet (2.5 mg total) by mouth 2 (two) times daily. 60 tablet 1   benzonatate (TESSALON) 100 MG capsule Take 1 capsule (100 mg total) by mouth 3 (three) times daily as needed for cough. 21 capsule 0   carboxymethylcellulose (REFRESH PLUS) 0.5 % SOLN 1 drop 2 (two) times daily as needed.     Cholecalciferol 1000 units capsule Take 1,000 Units by mouth daily.      citalopram (CELEXA) 20 MG tablet Take 20 mg by mouth daily.      clobetasol cream (TEMOVATE) 6.22 % Apply 1 application topically 2 (two) times daily as needed.     diltiazem (CARDIZEM CD) 180 MG 24 hr capsule Take 1 capsule (180 mg total) by mouth daily. 90 capsule 1   furosemide (LASIX) 20 MG tablet Take 1 tablet (20 mg total) by mouth daily for 3 days. 30 tablet 0   hydrocortisone (ANUSOL-HC) 2.5 % rectal cream Place 1  application rectally 2 (two) times daily as needed for hemorrhoids.      hyoscyamine (LEVSIN SL) 0.125 MG SL tablet PLACE 1 TABLET UNDER TONGUE THREE TIMES DAILY AS NEEDED 90 tablet 2   Lactobacillus-Inulin (CULTURELLE DIGESTIVE HEALTH PO) Take 1 tablet by mouth daily.     loratadine (CLARITIN) 10 MG tablet Take 10 mg by mouth daily.     MISC NATURAL PRODUCTS PO Take 1 tablet by mouth daily. Hair Volume     predniSONE (DELTASONE) 20 MG tablet 3 tabs po day one, then 2 tabs daily x 4 days 11 tablet 0   Propylene Glycol (SYSTANE BALANCE) 0.6 % SOLN Apply 1 drop to eye 2 (two) times a day. (Patient not taking: Reported on 03/29/2021)     SF 5000 PLUS 1.1 % CREA dental cream USE IN PLACE OF REGULAR TOOTHPASTE, BRUSH BEFORE BEDTIME FOR TWO MINUTES, THEN EXPECTORATE. NO RINSING, EATING OR DRINKING FOR 30 MINUTES.  99   vitamin B-12 (CYANOCOBALAMIN) 500 MCG tablet Take 1 tablet (500 mcg total) by mouth daily.     No current facility-administered medications for this visit.     Past Surgical History:  Procedure Laterality Date   CATARACT EXTRACTION W/PHACO Left 02/02/2017   Procedure: CATARACT EXTRACTION  PHACO AND INTRAOCULAR LENS PLACEMENT (IOC);  Surgeon: Baruch Goldmann, MD;  Location: [facility];  Service: Ophthalmology;  Laterality: Left;  CDE: 14.49   CATARACT EXTRACTION W/PHACO Right 03/02/2017   Procedure: CATARACT EXTRACTION PHACO AND INTRAOCULAR LENS PLACEMENT (IOC);  Surgeon: Baruch Goldmann, MD;  Location: [facility];  Service: Ophthalmology;  Laterality: Right;  CDE: 12.29   COLONOSCOPY N/A 07/24/2014   Procedure: COLONOSCOPY;  Surgeon: Rogene Houston, MD;  Location: [facility];  Service: Endoscopy;  Laterality: N/A;  900   HEMORRHOID SURGERY     TONSILLECTOMY       Allergies  Allergen Reactions   Mercury Other (See Comments)    unknown      Family History  Problem Relation Age of Onset   Congestive Heart Failure Mother      Social History [name] reports that she has never  smoked. She has never used smokeless tobacco. [name] reports no history of alcohol use.   Review of Systems CONSTITUTIONAL: No weight loss, fever, chills, weakness or fatigue.  HEENT: Eyes: No visual loss, blurred vision, double vision or yellow sclerae.No hearing loss, sneezing, congestion, runny nose or sore throat.  SKIN: No rash or itching.  CARDIOVASCULAR: per hpi RESPIRATORY: No shortness of breath, cough or sputum.  GASTROINTESTINAL: No anorexia, nausea, vomiting or diarrhea. No abdominal pain or blood.  GENITOURINARY: No burning on urination, no polyuria NEUROLOGICAL: No headache, dizziness, syncope, paralysis, ataxia, numbness or tingling in the extremities. No change in bowel or bladder control.  MUSCULOSKELETAL: No muscle, back pain, joint pain or stiffness.  LYMPHATICS: No enlarged nodes. No history of splenectomy.  PSYCHIATRIC: No history of depression or anxiety.  ENDOCRINOLOGIC: No reports of sweating, cold or heat intolerance. No polyuria or polydipsia.  Marland Kitchen   Physical Examination Today's Vitals   10/06/21 1440  BP: 140/76  Pulse: 86  SpO2: 97%  Weight: 122 lb 9.6 oz (55.6 kg)  Height: 5' 4.5" (1.638 m)   Body mass index is 20.72 kg/m.  Gen: resting comfortably, no acute distress HEENT: no scleral icterus, pupils equal round and reactive, no palptable cervical adenopathy,  CV: RRR, no m/rg, no jvd Resp: Clear to auscultation bilaterally GI: abdomen is soft, non-tender, non-distended, normal bowel sounds, no hepatosplenomegaly MSK: extremities are warm, no edema.  Skin: warm, no rash Neuro:  no focal deficits Psych: appropriate affect   Diagnostic Studies  Echocardiogram 05/25/2018:   - Left ventricle: The cavity size was normal. Wall thickness was   normal. Systolic function was normal. The estimated ejection   fraction was in the range of 60% to 65%. Wall motion was normal;   there were no regional wall motion abnormalities. Doppler   parameters are  consistent with abnormal left ventricular   relaxation (grade 1 diastolic dysfunction). - Mitral valve: There was mild regurgitation. - Pulmonary arteries: PA peak pressure: 44 mm Hg (S). - Impressions: The LV outflow trract is very narrow but no   significant LVOT obstruction. No evidence of SAM.   Impressions:   - The LV outflow trract is very narrow but no significant LVOT   obstruction. No evidence of SAM.   03/2021 echo 1. Left ventricular ejection fraction, by estimation, is 65 to 70%. The  left ventricle has normal function. The left ventricle has no regional  wall motion abnormalities. Left ventricular diastolic parameters are  indeterminate. Elevated left atrial  pressure.   2. Right ventricular systolic function is normal. The right ventricular  size is normal.  There is mildly elevated pulmonary artery systolic  pressure.   3. Left atrial size was mildly dilated.   4. The mitral valve is abnormal. Mild mitral valve regurgitation. No  evidence of mitral stenosis.   5. The tricuspid valve is abnormal.   6. The aortic valve is tricuspid. Aortic valve regurgitation is not  visualized. No aortic stenosis is present.   7. IVC is small, suggesting low RA pressure and hypovolemia.   Assessment and Plan   Paroxysmal Afib/acquired thrombophilia -no symptoms, continue current meds including eliquis for stroke prevention.       2. HTN - elevated here but home numbers well within goal, which is her normal pattern - we will continue current meds, home numbers remain at goal        F/u 6 months Arnoldo Lenis, M.D.

## 2021-10-06 NOTE — Patient Instructions (Addendum)
Medication Instructions:  Continue all current medications.   Labwork: none  Testing/Procedures: none  Follow-Up: 6 months   Any Other Special Instructions Will Be Listed Below (If Applicable).   If you need a refill on your cardiac medications before your next appointment, please call your pharmacy.  

## 2021-10-11 ENCOUNTER — Encounter: Payer: Self-pay | Admitting: *Deleted

## 2021-11-30 DIAGNOSIS — H04123 Dry eye syndrome of bilateral lacrimal glands: Secondary | ICD-10-CM | POA: Diagnosis not present

## 2021-12-22 ENCOUNTER — Other Ambulatory Visit: Payer: Self-pay

## 2021-12-22 ENCOUNTER — Encounter (HOSPITAL_COMMUNITY): Payer: Self-pay | Admitting: Emergency Medicine

## 2021-12-22 ENCOUNTER — Emergency Department (HOSPITAL_COMMUNITY)
Admission: EM | Admit: 2021-12-22 | Discharge: 2021-12-22 | Disposition: A | Discharge status: Home / Self Care | Payer: Medicare Other | Attending: Emergency Medicine | Admitting: Emergency Medicine

## 2021-12-22 DIAGNOSIS — Z7901 Long term (current) use of anticoagulants: Secondary | ICD-10-CM | POA: Insufficient documentation

## 2021-12-22 DIAGNOSIS — R112 Nausea with vomiting, unspecified: Secondary | ICD-10-CM | POA: Diagnosis not present

## 2021-12-22 DIAGNOSIS — R109 Unspecified abdominal pain: Secondary | ICD-10-CM | POA: Insufficient documentation

## 2021-12-22 DIAGNOSIS — R531 Weakness: Secondary | ICD-10-CM | POA: Insufficient documentation

## 2021-12-22 LAB — CBC
HCT: 45.7 % (ref 36.0–46.0)
Hemoglobin: 14.8 g/dL (ref 12.0–15.0)
MCH: 30.6 pg (ref 26.0–34.0)
MCHC: 32.4 g/dL (ref 30.0–36.0)
MCV: 94.6 fL (ref 80.0–100.0)
Platelets: 147 10*3/uL — ABNORMAL LOW (ref 150–400)
RBC: 4.83 MIL/uL (ref 3.87–5.11)
RDW: 12.8 % (ref 11.5–15.5)
WBC: 4.9 10*3/uL (ref 4.0–10.5)
nRBC: 0 % (ref 0.0–0.2)

## 2021-12-22 LAB — COMPREHENSIVE METABOLIC PANEL
ALT: 22 U/L (ref 0–44)
AST: 24 U/L (ref 15–41)
Albumin: 4.1 g/dL (ref 3.5–5.0)
Alkaline Phosphatase: 76 U/L (ref 38–126)
Anion gap: 9 (ref 5–15)
BUN: 24 mg/dL — ABNORMAL HIGH (ref 8–23)
CO2: 21 mmol/L — ABNORMAL LOW (ref 22–32)
Calcium: 9.3 mg/dL (ref 8.9–10.3)
Chloride: 102 mmol/L (ref 98–111)
Creatinine, Ser: 0.93 mg/dL (ref 0.44–1.00)
GFR, Estimated: 59 mL/min — ABNORMAL LOW (ref >60)
Glucose, Bld: 114 mg/dL — ABNORMAL HIGH (ref 70–99)
Potassium: 3.9 mmol/L (ref 3.5–5.1)
Sodium: 132 mmol/L — ABNORMAL LOW (ref 135–145)
Total Bilirubin: 0.7 mg/dL (ref 0.3–1.2)
Total Protein: 7.6 g/dL (ref 6.5–8.1)

## 2021-12-22 LAB — URINALYSIS, ROUTINE W REFLEX MICROSCOPIC
Bilirubin Urine: NEGATIVE
Glucose, UA: NEGATIVE mg/dL
Ketones, ur: NEGATIVE mg/dL
Leukocytes,Ua: NEGATIVE
Nitrite: NEGATIVE
Protein, ur: 30 mg/dL — AB
Specific Gravity, Urine: 1.021 (ref 1.005–1.030)
pH: 5 (ref 5.0–8.0)

## 2021-12-22 LAB — LIPASE, BLOOD: Lipase: 26 U/L (ref 11–51)

## 2021-12-22 LAB — CBG MONITORING, ED: Glucose-Capillary: 118 mg/dL — ABNORMAL HIGH (ref 70–99)

## 2021-12-22 NOTE — ED Provider Notes (Signed)
[facility] EMERGENCY DEPARTMENT Provider Note   CSN: [id number] Arrival date & time: 12/22/21  1422     History  Chief Complaint  Patient presents with   Abdominal Pain    Kamariya Blevens Trueba is a 88 y.o. female.  Patient is an 88 year old female with past medical history of A-fib and IBS who presents to the ED in no acute distress for evaluation of weakness.  Nurse triage note shows that patient presents with abdominal pain, nausea, vomiting, but upon questioning patient states that all of the symptoms have resolved and only weakness remains.  Patient is anticoagulated with Eliquis.  Patient states that on Monday she began having abdominal pain which felt like her normal IBS flares.  She was not able to eat or drink that day.  She states the next day she tried to drink Gatorade but threw it up.  She was able to keep down water and crackers.  She had water and crackers yesterday as well.  She states that she has been back to her normal diet today and ate boiled potatoes and Jell-O salad.  She states that due to her IBS she has multiple liquid stools per day.  This has not changed.  Patient states that she now just feels weak and would like to know why.  She denies melena or hematochezia or hematemesis.  Currently denies nausea, vomiting, diarrhea, chest pain, shortness of breath, numbness, tingling, changes in her vision, urinary symptoms.  Last colonoscopy 2016.   Abdominal Pain      Home Medications Prior to Admission medications   Medication Sig Start Date End Date Taking? Authorizing Provider  ALPRAZolam (XANAX) 0.25 MG tablet Take 0.125 mg by mouth daily as needed for anxiety or sleep.     [provider]  apixaban (ELIQUIS) 2.5 MG TABS tablet Take 1 tablet (2.5 mg total) by mouth 2 (two) times daily. 05/25/18   Caren Griffins, MD  benzonatate (TESSALON) 100 MG capsule Take 1 capsule (100 mg total) by mouth 3 (three) times daily as needed for cough. 07/23/21   Mesner,  Corene Cornea, MD  carboxymethylcellulose (REFRESH PLUS) 0.5 % SOLN 1 drop 2 (two) times daily as needed.    [provider]  Cholecalciferol 1000 units capsule Take 1,000 Units by mouth daily.     [provider]  citalopram (CELEXA) 20 MG tablet Take 20 mg by mouth daily.     [provider]  clobetasol cream (TEMOVATE) 9.37 % Apply 1 application topically 2 (two) times daily as needed. 10/05/18   [provider]  diltiazem (CARDIZEM CD) 180 MG 24 hr capsule Take 1 capsule (180 mg total) by mouth daily. 09/06/21   Arnoldo Lenis, MD  hydrocortisone (ANUSOL-HC) 2.5 % rectal cream Place 1 application rectally 2 (two) times daily as needed for hemorrhoids.     [provider]  hyoscyamine (LEVSIN SL) 0.125 MG SL tablet PLACE 1 TABLET UNDER TONGUE THREE TIMES DAILY AS NEEDED 04/18/13   Rehman, Mechele Dawley, MD  Lactobacillus-Inulin (CULTURELLE DIGESTIVE HEALTH PO) Take 1 tablet by mouth daily.    [provider]  loratadine (CLARITIN) 10 MG tablet Take 10 mg by mouth daily.    [provider]  MISC NATURAL PRODUCTS PO Take 1 tablet by mouth daily. Hair Volume    [provider]  predniSONE (DELTASONE) 20 MG tablet 3 tabs po day one, then 2 tabs daily x 4 days 07/23/21   Mesner, Corene Cornea, MD  Propylene Glycol (SYSTANE  BALANCE) 0.6 % SOLN Apply 1 drop to eye 2 (two) times a day. Patient not taking: Reported on 03/29/2021    [provider]  SF 5000 PLUS 1.1 % CREA dental cream USE IN PLACE OF REGULAR TOOTHPASTE, BRUSH BEFORE BEDTIME FOR TWO MINUTES, THEN EXPECTORATE. NO RINSING, EATING OR DRINKING FOR 30 MINUTES. 12/23/16   [provider]  vitamin B-12 (CYANOCOBALAMIN) 500 MCG tablet Take 1 tablet (500 mcg total) by mouth daily. 10/31/18   Orson Eva, MD      Allergies    Mercury    Review of Systems   Review of Systems  Gastrointestinal:  Positive for abdominal pain.    Physical Exam Updated Vital Signs BP 123/75 (BP  Location: Right Arm)   Pulse 73   Temp 97.6 F (36.4 C) (Oral)   Resp 15   Ht 5' 4.5" (1.638 m)   Wt 52.3 kg   SpO2 98%   BMI 19.49 kg/m  Physical Exam Vitals and nursing note reviewed.  Constitutional:      General: She is not in acute distress.    Appearance: She is well-developed. She is not ill-appearing.  HENT:     Head: Normocephalic and atraumatic.  Eyes:     Extraocular Movements: Extraocular movements intact.     Conjunctiva/sclera: Conjunctivae normal.     Pupils: Pupils are equal, round, and reactive to light.  Cardiovascular:     Rate and Rhythm: Normal rate. Rhythm irregular.     Heart sounds: No murmur heard.    Comments: History of A-fib Pulmonary:     Effort: Pulmonary effort is normal. No respiratory distress.     Breath sounds: Normal breath sounds. No wheezing.  Abdominal:     General: Abdomen is flat. Bowel sounds are normal. There is no distension.     Palpations: Abdomen is soft.     Tenderness: There is no abdominal tenderness. Negative signs include Murphy's sign and McBurney's sign.  Musculoskeletal:        General: No swelling.     Cervical back: Neck supple.  Skin:    General: Skin is warm and dry.     Capillary Refill: Capillary refill takes less than 2 seconds.  Neurological:     General: No focal deficit present.     Mental Status: She is alert and oriented to person, place, and time.     Cranial Nerves: No cranial nerve deficit.     Motor: No weakness.     Comments: Patient is ambulatory and shows no signs of global weakness.  Psychiatric:        Mood and Affect: Mood normal.     ED Results / Procedures / Treatments   Labs (all labs ordered are listed, but only abnormal results are displayed) Labs Reviewed  COMPREHENSIVE METABOLIC PANEL - Abnormal; Notable for the following components:      Result Value   Sodium 132 (*)    CO2 21 (*)    Glucose, Bld 114 (*)    BUN 24 (*)    GFR, Estimated 59 (*)    All other components within  normal limits  CBC - Abnormal; Notable for the following components:   Platelets 147 (*)    All other components within normal limits  URINALYSIS, ROUTINE W REFLEX MICROSCOPIC - Abnormal; Notable for the following components:   Hgb urine dipstick MODERATE (*)    Protein, ur 30 (*)    Bacteria, UA RARE (*)    All other  components within normal limits  CBG MONITORING, ED - Abnormal; Notable for the following components:   Glucose-Capillary 118 (*)    All other components within normal limits  URINE CULTURE  LIPASE, BLOOD    EKG EKG Interpretation  Date/Time:  Thursday December 22 2021 16:15:40 EDT Ventricular Rate:  70 PR Interval:  81 QRS Duration: 86 QT Interval:  351 QTC Calculation: 379 R Axis:   58 Text Interpretation: Sinus or ectopic atrial rhythm Short PR interval Repol abnrm suggests ischemia, diffuse leads similar EKG to multiple prior Confirmed by Noemi Chapel [phone]) on 12/22/2021 4:39:30 PM  Radiology No results found.  Procedures Procedures    Medications Ordered in ED Medications - No data to display  ED Course/ Medical Decision Making/ A&P Clinical Course as of 12/22/21 2146  Thu Dec 22, 2021  1835 CBC(!) CBC within normal limits, no signs of systemic infection. [AS]  1835 Comprehensive metabolic panel(!) CMP shows no overt signs of illness or severe electrolyte imbalance [AS]  1836 EKG 12-Lead Reviewed EKG.  No STEMI. [AS]  2017 Patient has been unable to urinate over the past 2 hours.  Nursing will perform In-and-Out cath for urine sample. [AS]  2048 Urinalysis, Routine w reflex microscopic Urine, In & Out Cath(!) Urinalysis negative for infection [AS]    Clinical Course User Index [AS] Roylene Reason, PA-C                           Medical Decision Making This patient presents to the ED for concern of weakness since 12/19/2021, this involves an extensive number of treatment options, and is a complaint that carries with it a high risk of  complications and morbidity.  The differential diagnosis includes dehydration, UTI, malnourishment, CVA, pancreatitis   Co morbidities that complicate the patient evaluation       IBS, A-fib  My initial workup includes obtaining cbc, cmp, lipase, urinalysis, ekg  Additional history obtained from: Nursing notes from this visit. Family: husband present during evaluation  Consultations: No consultations  I ordered, reviewed and interpreted labs which include: CBC, CMP, lipase, blood glucose.  These are mostly within normal limits and show no conclusive evidence of illness.  Specifically no overt electrolyte imbalance, no leukocytosis, negative urinalysis  Hemodynamically stable and afebrile.  After review and interpretation of labs and imaging, history and physical exam, I believe this patient is likely suffering from mild dehydration and malnourishment due to limited food and beverage consumption over the past 4 days secondary to either IBS flare or gastroenteritis.  Patient is ambulatory and shows no obvious signs of weakness on exam.  Due to age and no found cause of her symptoms, I will send the urine sample to culture.  At this time there does not appear to be any evidence of an acute emergency medical condition and the patient appears stable for discharge with appropriate outpatient follow up. Diagnosis was discussed with patient who verbalizes understanding of care plan and is agreeable to discharge. I have discussed return precautions with patient who verbalizes understanding. Patient encouraged to follow-up with their PCP within 1 week. All questions answered.  Patient's case discussed with Dr. Sabra Heck who agrees with plan to discharge with follow-up.   Note: Portions of this report may have been transcribed using voice recognition software. Every effort was made to ensure accuracy; however, inadvertent computerized transcription errors may still be present.   Amount and/or Complexity  of Data Reviewed Labs:  ordered.           Final Clinical Impression(s) / ED Diagnoses Final diagnoses:  Generalized weakness    Rx / DC Orders ED Discharge Orders     None         Nehemiah Massed 12/22/21 2146    Noemi Chapel, MD 12/25/21 [phone]

## 2021-12-22 NOTE — ED Triage Notes (Signed)
Pt presents with abdominal discomfort w/N/V/D since Monday, history of IBS.

## 2021-12-22 NOTE — Discharge Instructions (Signed)
You have been seen today for your complaint of weakness. Your lab work shows signs of mild dehydration, otherwise with normal. Home care instructions are as follows:  You should continue to eat and drink as you normally would and as you can tolerate. Follow up with: Your primary care provider in 1 week. Please seek immediate medical care if you develop any of the following symptoms: Chest pain, shortness of breath, increasing weakness, confusion, urinary symptoms  At this time there does not appear to be the presence of an emergent medical condition, however there is always the potential for conditions to change. Please read and follow the below instructions.  Do not take your medicine if  develop an itchy rash, swelling in your mouth or lips, or difficulty breathing; call 911 and seek immediate emergency medical attention if this occurs.  You may review your lab tests and imaging results in their entirety on your MyChart account.  Please discuss all results of fully with your primary care provider and other specialist at your follow-up visit.  Note: Portions of this text may have been transcribed using voice recognition software. Every effort was made to ensure accuracy; however, inadvertent computerized transcription errors may still be present.

## 2021-12-22 NOTE — ED Notes (Signed)
Pt has attempted x 2 to get urine sample without success.  Pt not able to urinate at this time. EDP made aware and verbal order for in and out cath.

## 2021-12-24 LAB — URINE CULTURE: Culture: NO GROWTH

## 2021-12-26 DIAGNOSIS — R300 Dysuria: Secondary | ICD-10-CM | POA: Diagnosis not present

## 2021-12-26 DIAGNOSIS — Z6820 Body mass index (BMI) 20.0-20.9, adult: Secondary | ICD-10-CM | POA: Diagnosis not present

## 2022-01-02 DIAGNOSIS — R928 Other abnormal and inconclusive findings on diagnostic imaging of breast: Secondary | ICD-10-CM | POA: Diagnosis not present

## 2022-01-02 DIAGNOSIS — R922 Inconclusive mammogram: Secondary | ICD-10-CM | POA: Diagnosis not present

## 2022-01-05 ENCOUNTER — Other Ambulatory Visit: Payer: Self-pay | Admitting: Cardiology

## 2022-01-05 DIAGNOSIS — R300 Dysuria: Secondary | ICD-10-CM | POA: Diagnosis not present

## 2022-01-17 DIAGNOSIS — R300 Dysuria: Secondary | ICD-10-CM | POA: Diagnosis not present

## 2022-02-07 DIAGNOSIS — Z23 Encounter for immunization: Secondary | ICD-10-CM | POA: Diagnosis not present

## 2022-03-08 DIAGNOSIS — Z23 Encounter for immunization: Secondary | ICD-10-CM | POA: Diagnosis not present

## 2022-04-04 DIAGNOSIS — R946 Abnormal results of thyroid function studies: Secondary | ICD-10-CM | POA: Diagnosis not present

## 2022-04-04 DIAGNOSIS — Z13220 Encounter for screening for lipoid disorders: Secondary | ICD-10-CM | POA: Diagnosis not present

## 2022-04-04 DIAGNOSIS — D696 Thrombocytopenia, unspecified: Secondary | ICD-10-CM | POA: Diagnosis not present

## 2022-04-05 ENCOUNTER — Other Ambulatory Visit: Payer: Self-pay | Admitting: Cardiology

## 2022-04-11 DIAGNOSIS — Z23 Encounter for immunization: Secondary | ICD-10-CM | POA: Diagnosis not present

## 2022-04-11 DIAGNOSIS — D696 Thrombocytopenia, unspecified: Secondary | ICD-10-CM | POA: Diagnosis not present

## 2022-04-11 DIAGNOSIS — J301 Allergic rhinitis due to pollen: Secondary | ICD-10-CM | POA: Diagnosis not present

## 2022-04-11 DIAGNOSIS — K580 Irritable bowel syndrome with diarrhea: Secondary | ICD-10-CM | POA: Diagnosis not present

## 2022-04-11 DIAGNOSIS — I482 Chronic atrial fibrillation, unspecified: Secondary | ICD-10-CM | POA: Diagnosis not present

## 2022-04-11 DIAGNOSIS — Z6821 Body mass index (BMI) 21.0-21.9, adult: Secondary | ICD-10-CM | POA: Diagnosis not present

## 2022-04-11 DIAGNOSIS — Z0001 Encounter for general adult medical examination with abnormal findings: Secondary | ICD-10-CM | POA: Diagnosis not present

## 2022-04-13 ENCOUNTER — Encounter: Payer: Self-pay | Admitting: Cardiology

## 2022-04-13 ENCOUNTER — Ambulatory Visit: Payer: Medicare Other | Attending: Cardiology | Admitting: Cardiology

## 2022-04-13 VITALS — BP 134/60 | HR 68 | Ht 64.5 in | Wt 122.6 lb

## 2022-04-13 DIAGNOSIS — R600 Localized edema: Secondary | ICD-10-CM

## 2022-04-13 DIAGNOSIS — I480 Paroxysmal atrial fibrillation: Secondary | ICD-10-CM

## 2022-04-13 DIAGNOSIS — I10 Essential (primary) hypertension: Secondary | ICD-10-CM

## 2022-04-13 NOTE — Patient Instructions (Signed)
Medication Instructions:  Continue all current medications.   Labwork: none  Testing/Procedures: none  Follow-Up: 6 months   Any Other Special Instructions Will Be Listed Below (If Applicable).   If you need a refill on your cardiac medications before your next appointment, please call your pharmacy.  

## 2022-04-13 NOTE — Progress Notes (Signed)
Clinical Summary [name] is a 88 y.o.female seen today for follow up of the following medical problems.    1.PAF - no recent palpitations - compliant with meds      2. SOB 03/2021 echo: LVEF 65-70%, no WMAs, indet diastolic, elevated LA pressure, normal RV, mild MR - breathing has improved since last visit. She reports treatment for bronchitis.   - no recent troubles   3. HTN    - home bp's 110s-120s/50s-60s. HRs 50s-60s     4. Heart murmur  03/2019 echo LVEF 60-65%, narrow LVOT but no obstruction, mild MR 03/2021 echo LVEF65-705%, no WMAs, no significant valvualr abnormalities  5. LE edema - occasional LE edema - has prn lasix '20mg'$ , compression stockings.  Past Medical History:  Diagnosis Date   Atrial fibrillation (HCC)    Hypertension    IBS (irritable bowel syndrome)    PONV (postoperative nausea and vomiting)      Allergies  Allergen Reactions   Mercury Other (See Comments)    unknown     Current Outpatient Medications  Medication Sig Dispense Refill   ALPRAZolam (XANAX) 0.25 MG tablet Take 0.125 mg by mouth daily as needed for anxiety or sleep.      apixaban (ELIQUIS) 2.5 MG TABS tablet Take 1 tablet (2.5 mg total) by mouth 2 (two) times daily. 60 tablet 1   benzonatate (TESSALON) 100 MG capsule Take 1 capsule (100 mg total) by mouth 3 (three) times daily as needed for cough. 21 capsule 0   carboxymethylcellulose (REFRESH PLUS) 0.5 % SOLN 1 drop 2 (two) times daily as needed.     Cholecalciferol 1000 units capsule Take 1,000 Units by mouth daily.      citalopram (CELEXA) 20 MG tablet Take 20 mg by mouth daily.      clobetasol cream (TEMOVATE) 5.99 % Apply 1 application topically 2 (two) times daily as needed.     diltiazem (CARDIZEM CD) 180 MG 24 hr capsule TAKE ONE CAPSULE BY MOUTH DAILY 90 capsule 3   hydrocortisone (ANUSOL-HC) 2.5 % rectal cream Place 1 application rectally 2 (two) times daily as needed for hemorrhoids.      hyoscyamine  (LEVSIN SL) 0.125 MG SL tablet PLACE 1 TABLET UNDER TONGUE THREE TIMES DAILY AS NEEDED 90 tablet 2   Lactobacillus-Inulin (CULTURELLE DIGESTIVE HEALTH PO) Take 1 tablet by mouth daily.     loratadine (CLARITIN) 10 MG tablet Take 10 mg by mouth daily.     MISC NATURAL PRODUCTS PO Take 1 tablet by mouth daily. Hair Volume     predniSONE (DELTASONE) 20 MG tablet 3 tabs po day one, then 2 tabs daily x 4 days 11 tablet 0   Propylene Glycol (SYSTANE BALANCE) 0.6 % SOLN Apply 1 drop to eye 2 (two) times a day. (Patient not taking: Reported on 03/29/2021)     SF 5000 PLUS 1.1 % CREA dental cream USE IN PLACE OF REGULAR TOOTHPASTE, BRUSH BEFORE BEDTIME FOR TWO MINUTES, THEN EXPECTORATE. NO RINSING, EATING OR DRINKING FOR 30 MINUTES.  99   vitamin B-12 (CYANOCOBALAMIN) 500 MCG tablet Take 1 tablet (500 mcg total) by mouth daily.     No current facility-administered medications for this visit.     Past Surgical History:  Procedure Laterality Date   CATARACT EXTRACTION W/PHACO Left 02/02/2017   Procedure: CATARACT EXTRACTION PHACO AND INTRAOCULAR LENS PLACEMENT (IOC);  Surgeon: Baruch Goldmann, MD;  Location: [facility];  Service: Ophthalmology;  Laterality: Left;  CDE: 14.49  CATARACT EXTRACTION W/PHACO Right 03/02/2017   Procedure: CATARACT EXTRACTION PHACO AND INTRAOCULAR LENS PLACEMENT ([place]);  Surgeon: Baruch Goldmann, MD;  Location: [facility];  Service: Ophthalmology;  Laterality: Right;  CDE: 12.29   COLONOSCOPY N/A 07/24/2014   Procedure: COLONOSCOPY;  Surgeon: Rogene Houston, MD;  Location: [facility];  Service: Endoscopy;  Laterality: N/A;  900   HEMORRHOID SURGERY     TONSILLECTOMY       Allergies  Allergen Reactions   Mercury Other (See Comments)    unknown      Family History  Problem Relation Age of Onset   Congestive Heart Failure Mother      Social History Ms. Stmarie reports that she has never smoked. She has never used smokeless tobacco. Ms. Abascal reports no history of alcohol  use.   Review of Systems CONSTITUTIONAL: No weight loss, fever, chills, weakness or fatigue.  HEENT: Eyes: No visual loss, blurred vision, double vision or yellow sclerae.No hearing loss, sneezing, congestion, runny nose or sore throat.  SKIN: No rash or itching.  CARDIOVASCULAR: per hpi RESPIRATORY: No shortness of breath, cough or sputum.  GASTROINTESTINAL: No anorexia, nausea, vomiting or diarrhea. No abdominal pain or blood.  GENITOURINARY: No burning on urination, no polyuria NEUROLOGICAL: No headache, dizziness, syncope, paralysis, ataxia, numbness or tingling in the extremities. No change in bowel or bladder control.  MUSCULOSKELETAL: No muscle, back pain, joint pain or stiffness.  LYMPHATICS: No enlarged nodes. No history of splenectomy.  PSYCHIATRIC: No history of depression or anxiety.  ENDOCRINOLOGIC: No reports of sweating, cold or heat intolerance. No polyuria or polydipsia.  Marland Kitchen   Physical Examination Today's Vitals   04/13/22 0845  BP: 134/60  Pulse: 68  SpO2: 98%  Weight: 122 lb 9.6 oz (55.6 kg)  Height: 5' 4.5" (1.638 m)   Body mass index is 20.72 kg/m.  Gen: resting comfortably, no acute distress HEENT: no scleral icterus, pupils equal round and reactive, no palptable cervical adenopathy,  CV: RRR, 2/6 systolic murmur rusb, no jvd Resp: Clear to auscultation bilaterally GI: abdomen is soft, non-tender, non-distended, normal bowel sounds, no hepatosplenomegaly MSK: extremities are warm, no edema.  Skin: warm, no rash Neuro:  no focal deficits Psych: appropriate affect   Diagnostic Studies   Echoardiogram 05/25/2018:   - Left ventricle: The cavity size was normal. Wall thickness was   normal. Systolic function was normal. The estimated ejection   fraction was in the range of 60% to 65%. Wall motion was normal;   there were no regional wall motion abnormalities. Doppler   parameters are consistent with abnormal left ventricular   relaxation (grade 1  diastolic dysfunction). - Mitral valve: There was mild regurgitation. - Pulmonary arteries: PA peak pressure: 44 mm Hg (S). - Impressions: The LV outflow trract is very narrow but no   significant LVOT obstruction. No evidence of SAM.   Impressions:   - The LV outflow trract is very narrow but no significant LVOT   obstruction. No evidence of SAM.     03/2021 echo 1. Left ventricular ejection fraction, by estimation, is 65 to 70%. The  left ventricle has normal function. The left ventricle has no regional  wall motion abnormalities. Left ventricular diastolic parameters are  indeterminate. Elevated left atrial  pressure.   2. Right ventricular systolic function is normal. The right ventricular  size is normal. There is mildly elevated pulmonary artery systolic  pressure.   3. Left atrial size was mildly dilated.   4.  The mitral valve is abnormal. Mild mitral valve regurgitation. No  evidence of mitral stenosis.   5. The tricuspid valve is abnormal.   6. The aortic valve is tricuspid. Aortic valve regurgitation is not  visualized. No aortic stenosis is present.   7. IVC is small, suggesting low RA pressure and hypovolemia.    Assessment and Plan   Paroxysmal Afib/acquired thrombophilia -no recent symptoms, continue current meds including eliquis for stroke prevention. Eliquis low dose given age over 62 and wt <60kg       2. HTN - at goal continue current meds  3. LE edema - controlled with prn lasix, compression stocking - recent echo normal LVEF, indet diastolic fxn but signs of elevated LA pressure. Overall no worrisome findings to warrant treatment other than diuretic at this time     F/u 6 months     Arnoldo Lenis, M.D.

## 2022-04-20 DIAGNOSIS — H04123 Dry eye syndrome of bilateral lacrimal glands: Secondary | ICD-10-CM | POA: Diagnosis not present

## 2022-08-08 DIAGNOSIS — M25571 Pain in right ankle and joints of right foot: Secondary | ICD-10-CM | POA: Diagnosis not present

## 2022-08-08 DIAGNOSIS — M79671 Pain in right foot: Secondary | ICD-10-CM | POA: Diagnosis not present

## 2022-09-18 DIAGNOSIS — J329 Chronic sinusitis, unspecified: Secondary | ICD-10-CM | POA: Diagnosis not present

## 2022-09-18 DIAGNOSIS — Z6820 Body mass index (BMI) 20.0-20.9, adult: Secondary | ICD-10-CM | POA: Diagnosis not present

## 2022-09-20 DIAGNOSIS — J441 Chronic obstructive pulmonary disease with (acute) exacerbation: Secondary | ICD-10-CM | POA: Diagnosis not present

## 2022-09-20 DIAGNOSIS — Z6820 Body mass index (BMI) 20.0-20.9, adult: Secondary | ICD-10-CM | POA: Diagnosis not present

## 2022-10-03 DIAGNOSIS — E7849 Other hyperlipidemia: Secondary | ICD-10-CM | POA: Diagnosis not present

## 2022-10-03 DIAGNOSIS — E039 Hypothyroidism, unspecified: Secondary | ICD-10-CM | POA: Diagnosis not present

## 2022-10-06 ENCOUNTER — Telehealth: Payer: Self-pay | Admitting: Cardiology

## 2022-10-06 MED ORDER — DILTIAZEM HCL ER COATED BEADS 180 MG PO CP24: 180.0000 mg | ORAL_CAPSULE | Freq: Every day | ORAL | 1 refills | Status: DC

## 2022-10-06 NOTE — Telephone Encounter (Signed)
Done

## 2022-10-06 NOTE — Telephone Encounter (Signed)
*  STAT* If patient is at the pharmacy, call can be transferred to refill team.   1. Which medications need to be refilled? (please list name of each medication and dose if known) diltiazem (CARDIZEM CD) 180 MG 24 hr capsule   2. Which pharmacy/location (including street and city if local pharmacy) is medication to be sent to? [facility]   3. Do they need a 30 day or 90 day supply? 90 day  Patient doe snot have enough to make it to Tuesday

## 2022-10-11 DIAGNOSIS — R4582 Worries: Secondary | ICD-10-CM | POA: Diagnosis not present

## 2022-10-11 DIAGNOSIS — I4821 Permanent atrial fibrillation: Secondary | ICD-10-CM | POA: Diagnosis not present

## 2022-10-11 DIAGNOSIS — Z6820 Body mass index (BMI) 20.0-20.9, adult: Secondary | ICD-10-CM | POA: Diagnosis not present

## 2022-10-11 DIAGNOSIS — K580 Irritable bowel syndrome with diarrhea: Secondary | ICD-10-CM | POA: Diagnosis not present

## 2022-10-11 DIAGNOSIS — D696 Thrombocytopenia, unspecified: Secondary | ICD-10-CM | POA: Diagnosis not present

## 2022-10-11 DIAGNOSIS — J301 Allergic rhinitis due to pollen: Secondary | ICD-10-CM | POA: Diagnosis not present

## 2022-10-19 ENCOUNTER — Encounter: Payer: Self-pay | Admitting: Cardiology

## 2022-10-19 ENCOUNTER — Encounter: Payer: Self-pay | Admitting: *Deleted

## 2022-10-19 ENCOUNTER — Ambulatory Visit: Payer: Medicare Other | Attending: Cardiology | Admitting: Cardiology

## 2022-10-19 VITALS — BP 120/65 | HR 62 | Ht 65.0 in | Wt 115.0 lb

## 2022-10-19 DIAGNOSIS — I10 Essential (primary) hypertension: Secondary | ICD-10-CM

## 2022-10-19 DIAGNOSIS — R600 Localized edema: Secondary | ICD-10-CM | POA: Diagnosis not present

## 2022-10-19 DIAGNOSIS — I480 Paroxysmal atrial fibrillation: Secondary | ICD-10-CM

## 2022-10-19 NOTE — Progress Notes (Signed)
Clinical Summary Ms. Rho is a 89 y.o.female seen today for follow up of the following medical problems.    1.PAF - -no recent palpitaitons - compliant with meds - no bleeding on eliquis     2. SOB 03/2021 echo: LVEF 65-70%, no WMAs, indet diastolic, elevated LA pressure, normal RV, mild MR - breathing has improved since last visit. She reports treatment for bronchitis.    -no recent symptoms      3. HTN     -home bp 105-120s/50s-60s  4. Heart murmur  03/2019 echo LVEF 60-65%, narrow LVOT but no obstruction, mild MR 03/2021 echo LVEF65-70%, no WMAs, no significant valvualr abnormalities   5. LE edema - has prn lasix 20mg , compression stockings.   - no recent issues Past Medical History:  Diagnosis Date   Atrial fibrillation (HCC)    Hypertension    IBS (irritable bowel syndrome)    PONV (postoperative nausea and vomiting)      Allergies  Allergen Reactions   Mercury Other (See Comments)    unknown     Current Outpatient Medications  Medication Sig Dispense Refill   ALPRAZolam (XANAX) 0.25 MG tablet Take 0.125 mg by mouth daily as needed for anxiety or sleep.      apixaban (ELIQUIS) 2.5 MG TABS tablet Take 1 tablet (2.5 mg total) by mouth 2 (two) times daily. 60 tablet 1   ascorbic acid (VITAMIN C) 500 MG tablet Take 500 mg by mouth daily. Take 1 tablet by mouth daily.     carboxymethylcellulose (REFRESH PLUS) 0.5 % SOLN 1 drop 2 (two) times daily as needed.     Cholecalciferol 1000 units capsule Take 1,000 Units by mouth daily.      citalopram (CELEXA) 20 MG tablet Take 20 mg by mouth daily.      clobetasol cream (TEMOVATE) 0.05 % Apply 1 application topically 2 (two) times daily as needed.     diltiazem (CARDIZEM CD) 180 MG 24 hr capsule Take 1 capsule (180 mg total) by mouth daily. 90 capsule 1   furosemide (LASIX) 20 MG tablet Take 20 mg by mouth daily. Take 1 tablet as needed for swelling.     hydrocortisone (ANUSOL-HC) 2.5 % rectal cream Place  1 application rectally 2 (two) times daily as needed for hemorrhoids.      hyoscyamine (LEVSIN SL) 0.125 MG SL tablet PLACE 1 TABLET UNDER TONGUE THREE TIMES DAILY AS NEEDED 90 tablet 2   Lactobacillus-Inulin (CULTURELLE DIGESTIVE HEALTH PO) Take 1 tablet by mouth daily.     loratadine (CLARITIN) 10 MG tablet Take 10 mg by mouth daily.     MISC NATURAL PRODUCTS PO Take 1 tablet by mouth daily. Hair Volume     Propylene Glycol (SYSTANE BALANCE) 0.6 % SOLN Apply 1 drop to eye 2 (two) times a day.     SF 5000 PLUS 1.1 % CREA dental cream USE IN PLACE OF REGULAR TOOTHPASTE, BRUSH BEFORE BEDTIME FOR TWO MINUTES, THEN EXPECTORATE. NO RINSING, EATING OR DRINKING FOR 30 MINUTES.  99   vitamin B-12 (CYANOCOBALAMIN) 500 MCG tablet Take 1 tablet (500 mcg total) by mouth daily.     predniSONE (DELTASONE) 20 MG tablet 3 tabs po day one, then 2 tabs daily x 4 days 11 tablet 0   No current facility-administered medications for this visit.     Past Surgical History:  Procedure Laterality Date   CATARACT EXTRACTION W/PHACO Left 02/02/2017   Procedure: CATARACT EXTRACTION PHACO AND INTRAOCULAR LENS  PLACEMENT (IOC);  Surgeon: Fabio Pierce, MD;  Location: [facility];  Service: Ophthalmology;  Laterality: Left;  CDE: 14.49   CATARACT EXTRACTION W/PHACO Right 03/02/2017   Procedure: CATARACT EXTRACTION PHACO AND INTRAOCULAR LENS PLACEMENT (IOC);  Surgeon: Fabio Pierce, MD;  Location: [facility];  Service: Ophthalmology;  Laterality: Right;  CDE: 12.29   COLONOSCOPY N/A 07/24/2014   Procedure: COLONOSCOPY;  Surgeon: Malissa Hippo, MD;  Location: [facility];  Service: Endoscopy;  Laterality: N/A;  900   HEMORRHOID SURGERY     TONSILLECTOMY       Allergies  Allergen Reactions   Mercury Other (See Comments)    unknown      Family History  Problem Relation Age of Onset   Congestive Heart Failure Mother      Social History Ms. Guiffre reports that she has never smoked. She has never used smokeless  tobacco. Ms. Marciano reports no history of alcohol use.   Review of Systems CONSTITUTIONAL: No weight loss, fever, chills, weakness or fatigue.  HEENT: Eyes: No visual loss, blurred vision, double vision or yellow sclerae.No hearing loss, sneezing, congestion, runny nose or sore throat.  SKIN: No rash or itching.  CARDIOVASCULAR: per hpi RESPIRATORY: No shortness of breath, cough or sputum.  GASTROINTESTINAL: No anorexia, nausea, vomiting or diarrhea. No abdominal pain or blood.  GENITOURINARY: No burning on urination, no polyuria NEUROLOGICAL: No headache, dizziness, syncope, paralysis, ataxia, numbness or tingling in the extremities. No change in bowel or bladder control.  MUSCULOSKELETAL: No muscle, back pain, joint pain or stiffness.  LYMPHATICS: No enlarged nodes. No history of splenectomy.  PSYCHIATRIC: No history of depression or anxiety.  ENDOCRINOLOGIC: No reports of sweating, cold or heat intolerance. No polyuria or polydipsia.  Marland Kitchen   Physical Examination Today's Vitals   10/19/22 1252 10/19/22 1316  BP: (!) 150/64 120/65  Pulse: 62   SpO2: 96%   Weight: 115 lb (52.2 kg)   Height: 5\' 5"  (1.651 m)    Body mass index is 19.14 kg/m.  Gen: resting comfortably, no acute distress HEENT: no scleral icterus, pupils equal round and reactive, no palptable cervical adenopathy,  CV: RRR, 2/6 systolic murjur rusb, no jvd Resp: Clear to auscultation bilaterally GI: abdomen is soft, non-tender, non-distended, normal bowel sounds, no hepatosplenomegaly MSK: extremities are warm, no edema.  Skin: warm, no rash Neuro:  no focal deficits Psych: appropriate affect   Diagnostic Studies Echoardiogram 05/25/2018:   - Left ventricle: The cavity size was normal. Wall thickness was   normal. Systolic function was normal. The estimated ejection   fraction was in the range of 60% to 65%. Wall motion was normal;   there were no regional wall motion abnormalities. Doppler   parameters are  consistent with abnormal left ventricular   relaxation (grade 1 diastolic dysfunction). - Mitral valve: There was mild regurgitation. - Pulmonary arteries: PA peak pressure: 44 mm Hg (S). - Impressions: The LV outflow trract is very narrow but no   significant LVOT obstruction. No evidence of SAM.   Impressions:   - The LV outflow trract is very narrow but no significant LVOT   obstruction. No evidence of SAM.     03/2021 echo 1. Left ventricular ejection fraction, by estimation, is 65 to 70%. The  left ventricle has normal function. The left ventricle has no regional  wall motion abnormalities. Left ventricular diastolic parameters are  indeterminate. Elevated left atrial  pressure.   2. Right ventricular systolic function is normal. The right  ventricular  size is normal. There is mildly elevated pulmonary artery systolic  pressure.   3. Left atrial size was mildly dilated.   4. The mitral valve is abnormal. Mild mitral valve regurgitation. No  evidence of mitral stenosis.   5. The tricuspid valve is abnormal.   6. The aortic valve is tricuspid. Aortic valve regurgitation is not  visualized. No aortic stenosis is present.   7. IVC is small, suggesting low RA pressure and hypovolemia.      Assessment and Plan  Paroxysmal Afib/acquired thrombophilia - no symptoms, continue current meds - continue eliquis for stroke prevention       2. HTN -he is at goal, continue current meds   3. LE edema - controlled, continue lasix   F/u 6 months          Antoine Poche, M.D.,

## 2022-10-19 NOTE — Patient Instructions (Addendum)

## 2022-11-02 DIAGNOSIS — H04123 Dry eye syndrome of bilateral lacrimal glands: Secondary | ICD-10-CM | POA: Diagnosis not present

## 2022-11-04 DIAGNOSIS — I482 Chronic atrial fibrillation, unspecified: Secondary | ICD-10-CM | POA: Diagnosis not present

## 2022-11-04 DIAGNOSIS — Z6820 Body mass index (BMI) 20.0-20.9, adult: Secondary | ICD-10-CM | POA: Diagnosis not present

## 2022-11-04 DIAGNOSIS — I10 Essential (primary) hypertension: Secondary | ICD-10-CM | POA: Diagnosis not present

## 2022-11-04 DIAGNOSIS — J019 Acute sinusitis, unspecified: Secondary | ICD-10-CM | POA: Diagnosis not present

## 2022-12-28 DIAGNOSIS — M79675 Pain in left toe(s): Secondary | ICD-10-CM | POA: Diagnosis not present

## 2022-12-28 DIAGNOSIS — L03032 Cellulitis of left toe: Secondary | ICD-10-CM | POA: Diagnosis not present

## 2023-01-11 DIAGNOSIS — M79675 Pain in left toe(s): Secondary | ICD-10-CM | POA: Diagnosis not present

## 2023-01-11 DIAGNOSIS — L03032 Cellulitis of left toe: Secondary | ICD-10-CM | POA: Diagnosis not present

## 2023-02-13 DIAGNOSIS — Z23 Encounter for immunization: Secondary | ICD-10-CM | POA: Diagnosis not present

## 2023-03-23 IMAGING — DX DG CHEST 1V PORT
1 series · 1 of 1 positions shown · non-contrast
Comparison: Chest radiographs 10/30/2018 and earlier.

CLINICAL DATA: 88-year-old female positive [id number]. Shortness of
breath.

EXAM:
PORTABLE CHEST 1 VIEW

[chest ap]
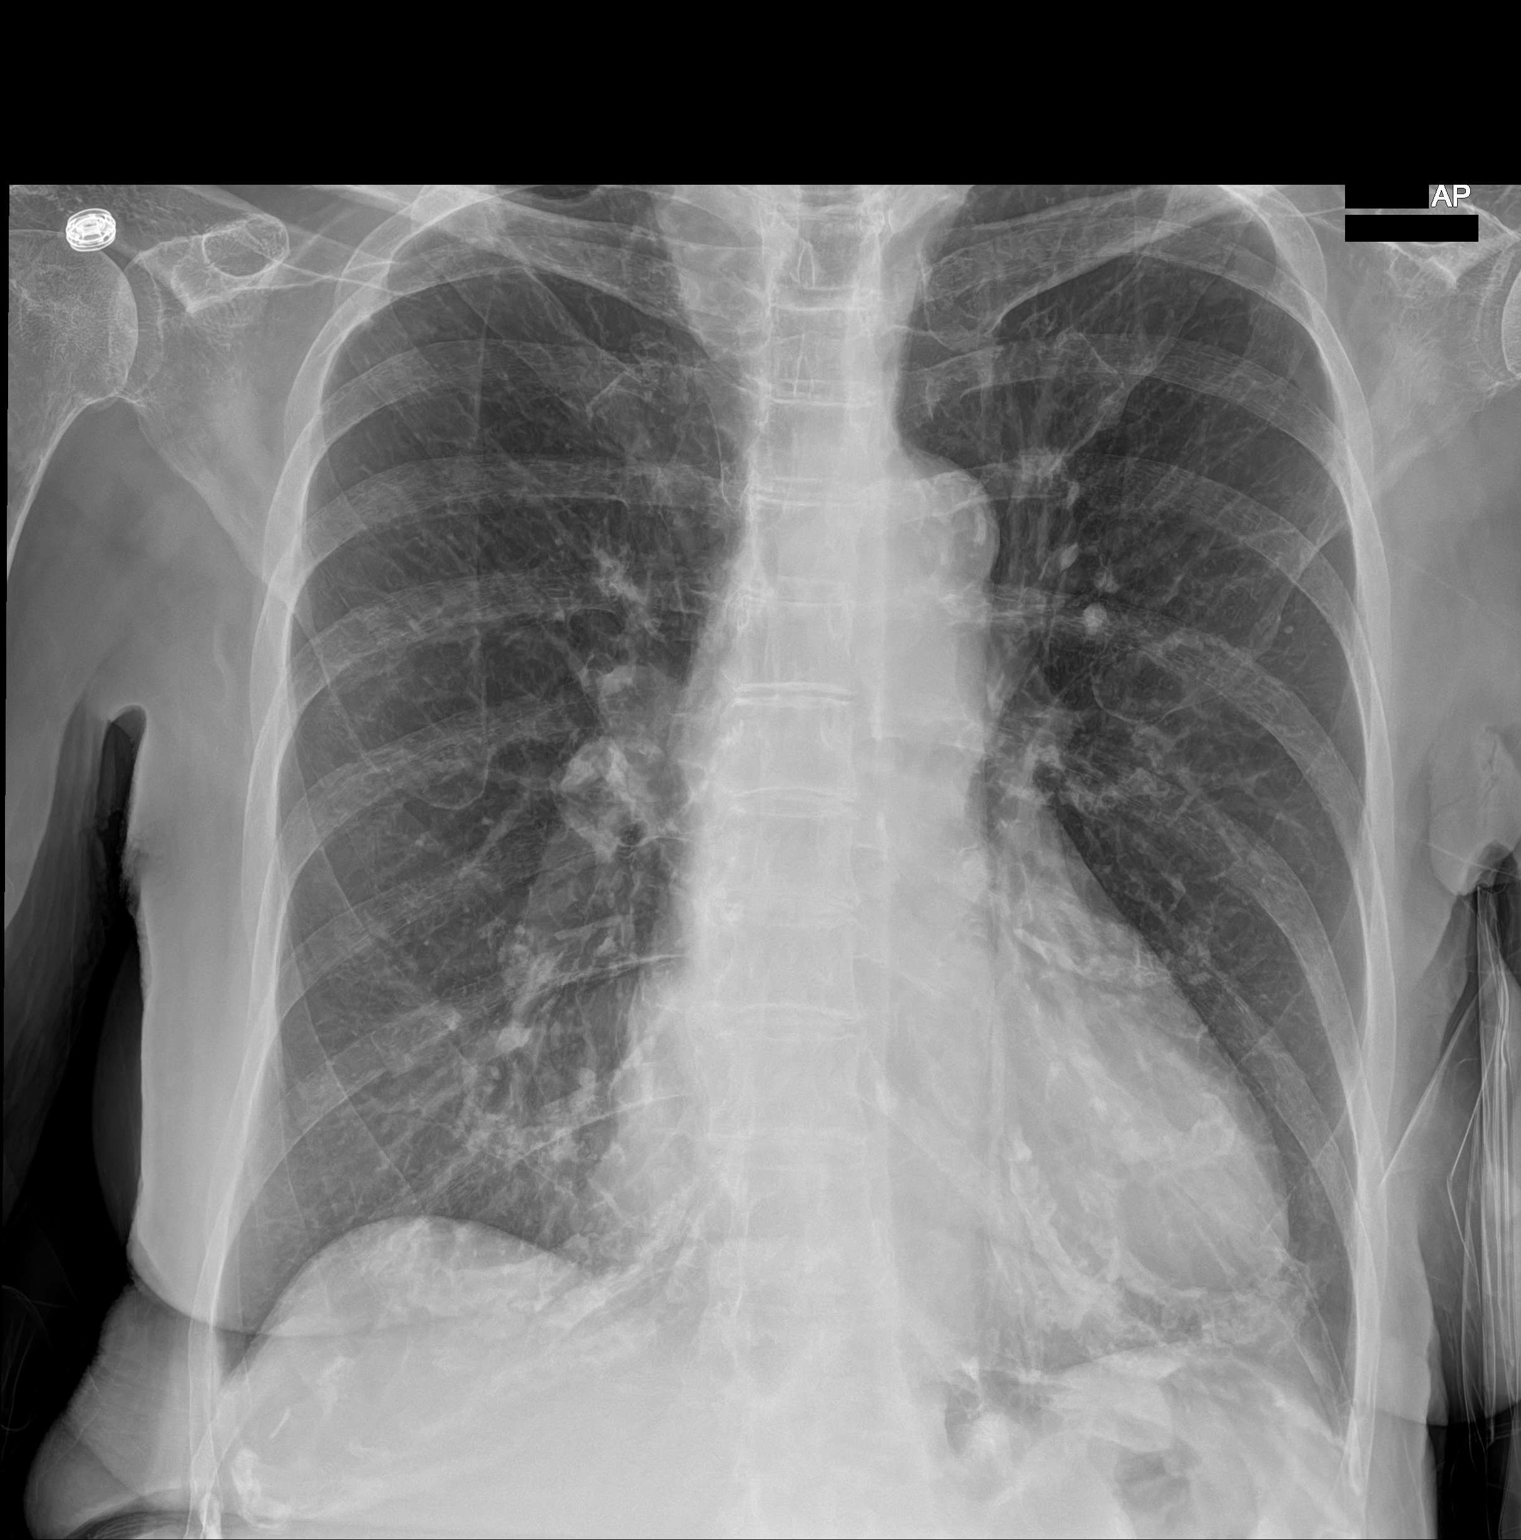

[1 of 1 positions shown; findings below may reference images not displayed]

FINDINGS: Portable AP upright view at 4866 hours. Chronically large lung
volumes. Chronic diffuse increased pulmonary interstitial opacity.
Stable cardiomegaly. Calcified aortic atherosclerosis. No evidence
of acute pulmonary embolus. Other mediastinal contours are within
normal limits. Visualized tracheal air column is within normal
limits. No pneumothorax, pulmonary edema, pleural effusion or
confluent pulmonary opacity. No acute osseous abnormality
identified.
IMPRESSION: No acute cardiopulmonary abnormality. Chronic lung disease with
hyperinflation. Cardiomegaly.

## 2023-04-10 DIAGNOSIS — R946 Abnormal results of thyroid function studies: Secondary | ICD-10-CM | POA: Diagnosis not present

## 2023-04-10 DIAGNOSIS — Z1321 Encounter for screening for nutritional disorder: Secondary | ICD-10-CM | POA: Diagnosis not present

## 2023-04-10 DIAGNOSIS — Z1329 Encounter for screening for other suspected endocrine disorder: Secondary | ICD-10-CM | POA: Diagnosis not present

## 2023-04-10 DIAGNOSIS — I10 Essential (primary) hypertension: Secondary | ICD-10-CM | POA: Diagnosis not present

## 2023-04-10 DIAGNOSIS — Z0001 Encounter for general adult medical examination with abnormal findings: Secondary | ICD-10-CM | POA: Diagnosis not present

## 2023-04-10 DIAGNOSIS — E7849 Other hyperlipidemia: Secondary | ICD-10-CM | POA: Diagnosis not present

## 2023-04-10 DIAGNOSIS — E039 Hypothyroidism, unspecified: Secondary | ICD-10-CM | POA: Diagnosis not present

## 2023-04-10 DIAGNOSIS — Z13220 Encounter for screening for lipoid disorders: Secondary | ICD-10-CM | POA: Diagnosis not present

## 2023-04-13 DIAGNOSIS — Z6821 Body mass index (BMI) 21.0-21.9, adult: Secondary | ICD-10-CM | POA: Diagnosis not present

## 2023-04-13 DIAGNOSIS — J019 Acute sinusitis, unspecified: Secondary | ICD-10-CM | POA: Diagnosis not present

## 2023-04-19 DIAGNOSIS — Z6821 Body mass index (BMI) 21.0-21.9, adult: Secondary | ICD-10-CM | POA: Diagnosis not present

## 2023-04-19 DIAGNOSIS — K580 Irritable bowel syndrome with diarrhea: Secondary | ICD-10-CM | POA: Diagnosis not present

## 2023-04-19 DIAGNOSIS — Z0001 Encounter for general adult medical examination with abnormal findings: Secondary | ICD-10-CM | POA: Diagnosis not present

## 2023-04-19 DIAGNOSIS — J301 Allergic rhinitis due to pollen: Secondary | ICD-10-CM | POA: Diagnosis not present

## 2023-04-19 DIAGNOSIS — Z23 Encounter for immunization: Secondary | ICD-10-CM | POA: Diagnosis not present

## 2023-04-19 DIAGNOSIS — R030 Elevated blood-pressure reading, without diagnosis of hypertension: Secondary | ICD-10-CM | POA: Diagnosis not present

## 2023-04-19 DIAGNOSIS — D692 Other nonthrombocytopenic purpura: Secondary | ICD-10-CM | POA: Diagnosis not present

## 2023-04-19 DIAGNOSIS — D696 Thrombocytopenia, unspecified: Secondary | ICD-10-CM | POA: Diagnosis not present

## 2023-04-19 DIAGNOSIS — I4821 Permanent atrial fibrillation: Secondary | ICD-10-CM | POA: Diagnosis not present

## 2023-04-26 ENCOUNTER — Ambulatory Visit: Payer: Medicare Other | Attending: Cardiology | Admitting: Cardiology

## 2023-04-26 VITALS — BP 130/64 | HR 62 | Ht 65.0 in | Wt 123.6 lb

## 2023-04-26 DIAGNOSIS — I10 Essential (primary) hypertension: Secondary | ICD-10-CM

## 2023-04-26 DIAGNOSIS — I480 Paroxysmal atrial fibrillation: Secondary | ICD-10-CM

## 2023-04-26 NOTE — Patient Instructions (Signed)
Medication Instructions:  Continue all current medications.   Labwork: none  Testing/Procedures: none  Follow-Up: 6 months   Any Other Special Instructions Will Be Listed Below (If Applicable).   If you need a refill on your cardiac medications before your next appointment, please call your pharmacy.  

## 2023-04-26 NOTE — Progress Notes (Signed)
Clinical Summary [name] is a 89 y.o.female seen today for follow up of the following medical problems.    1.PAF - no palpitations - compliant with meds. Low dose eliquis based on age >77 and wt <60kg     2. SOB 03/2021 echo: LVEF 65-70%, no WMAs, indet diastolic, elevated LA pressure, normal RV, mild MR - breathing has improved since last visit. She reports treatment for bronchitis.    -denies any symptoms         3. HTN   -home bp 120s/50s   4. Heart murmur  03/2019 echo LVEF 60-65%, narrow LVOT but no obstruction, mild MR 03/2021 echo LVEF65-70%, no WMAs, no significant valvualr abnormalities   5. LE edema - has prn lasix 20mg , compression stockings.  - mild edema at times.     Past Medical History:  Diagnosis Date   Atrial fibrillation (HCC)    Hypertension    IBS (irritable bowel syndrome)    PONV (postoperative nausea and vomiting)      Allergies  Allergen Reactions   Mercury Other (See Comments)    unknown     Current Outpatient Medications  Medication Sig Dispense Refill   ALPRAZolam (XANAX) 0.25 MG tablet Take 0.125 mg by mouth daily as needed for anxiety or sleep.      apixaban (ELIQUIS) 2.5 MG TABS tablet Take 1 tablet (2.5 mg total) by mouth 2 (two) times daily. 60 tablet 1   ascorbic acid (VITAMIN C) 500 MG tablet Take 500 mg by mouth daily. Take 1 tablet by mouth daily.     carboxymethylcellulose (REFRESH PLUS) 0.5 % SOLN 1 drop 2 (two) times daily as needed.     Cholecalciferol 1000 units capsule Take 1,000 Units by mouth daily.      citalopram (CELEXA) 20 MG tablet Take 20 mg by mouth daily.      clobetasol cream (TEMOVATE) 0.05 % Apply 1 application topically 2 (two) times daily as needed.     diltiazem (CARDIZEM CD) 180 MG 24 hr capsule Take 1 capsule (180 mg total) by mouth daily. 90 capsule 1   furosemide (LASIX) 20 MG tablet Take 20 mg by mouth daily. Take 1 tablet as needed for swelling.     hydrocortisone (ANUSOL-HC) 2.5 %  rectal cream Place 1 application rectally 2 (two) times daily as needed for hemorrhoids.      hyoscyamine (LEVSIN SL) 0.125 MG SL tablet PLACE 1 TABLET UNDER TONGUE THREE TIMES DAILY AS NEEDED 90 tablet 2   Lactobacillus-Inulin (CULTURELLE DIGESTIVE HEALTH PO) Take 1 tablet by mouth daily.     loratadine (CLARITIN) 10 MG tablet Take 10 mg by mouth daily.     MISC NATURAL PRODUCTS PO Take 1 tablet by mouth daily. Hair Volume     predniSONE (DELTASONE) 20 MG tablet 3 tabs po day one, then 2 tabs daily x 4 days 11 tablet 0   Propylene Glycol (SYSTANE BALANCE) 0.6 % SOLN Apply 1 drop to eye 2 (two) times a day.     SF 5000 PLUS 1.1 % CREA dental cream USE IN PLACE OF REGULAR TOOTHPASTE, BRUSH BEFORE BEDTIME FOR TWO MINUTES, THEN EXPECTORATE. NO RINSING, EATING OR DRINKING FOR 30 MINUTES.  99   vitamin B-12 (CYANOCOBALAMIN) 500 MCG tablet Take 1 tablet (500 mcg total) by mouth daily.     No current facility-administered medications for this visit.     Past Surgical History:  Procedure Laterality Date   CATARACT EXTRACTION W/PHACO Left  02/02/2017   Procedure: CATARACT EXTRACTION PHACO AND INTRAOCULAR LENS PLACEMENT (IOC);  Surgeon: Fabio Pierce, MD;  Location: [facility];  Service: Ophthalmology;  Laterality: Left;  CDE: 14.49   CATARACT EXTRACTION W/PHACO Right 03/02/2017   Procedure: CATARACT EXTRACTION PHACO AND INTRAOCULAR LENS PLACEMENT (IOC);  Surgeon: Fabio Pierce, MD;  Location: [facility];  Service: Ophthalmology;  Laterality: Right;  CDE: 12.29   COLONOSCOPY N/A 07/24/2014   Procedure: COLONOSCOPY;  Surgeon: Malissa Hippo, MD;  Location: [facility];  Service: Endoscopy;  Laterality: N/A;  900   HEMORRHOID SURGERY     TONSILLECTOMY       Allergies  Allergen Reactions   Mercury Other (See Comments)    unknown      Family History  Problem Relation Age of Onset   Congestive Heart Failure Mother      Social History [name] reports that she has never smoked. She has never  used smokeless tobacco. [name] reports no history of alcohol use.   Review of Systems CONSTITUTIONAL: No weight loss, fever, chills, weakness or fatigue.  HEENT: Eyes: No visual loss, blurred vision, double vision or yellow sclerae.No hearing loss, sneezing, congestion, runny nose or sore throat.  SKIN: No rash or itching.  CARDIOVASCULAR: per hpi RESPIRATORY: No shortness of breath, cough or sputum.  GASTROINTESTINAL: No anorexia, nausea, vomiting or diarrhea. No abdominal pain or blood.  GENITOURINARY: No burning on urination, no polyuria NEUROLOGICAL: No headache, dizziness, syncope, paralysis, ataxia, numbness or tingling in the extremities. No change in bowel or bladder control.  MUSCULOSKELETAL: No muscle, back pain, joint pain or stiffness.  LYMPHATICS: No enlarged nodes. No history of splenectomy.  PSYCHIATRIC: No history of depression or anxiety.  ENDOCRINOLOGIC: No reports of sweating, cold or heat intolerance. No polyuria or polydipsia.  Marland Kitchen   Physical Examination Today's Vitals   04/26/23 1103  BP: 130/64  Pulse: 62  SpO2: 98%  Weight: 123 lb 9.6 oz (56.1 kg)  Height: 5\' 5"  (1.651 m)   Body mass index is 20.57 kg/m.  Gen: resting comfortably, no acute distress HEENT: no scleral icterus, pupils equal round and reactive, no palptable cervical adenopathy,  CV: RRR, 2/6 systolic murmur rusb, no jvd Resp: Clear to auscultation bilaterally GI: abdomen is soft, non-tender, non-distended, normal bowel sounds, no hepatosplenomegaly MSK: extremities are warm, no edema.  Skin: warm, no rash Neuro:  no focal deficits Psych: appropriate affect   Diagnostic Studies Echoardiogram 05/25/2018:   - Left ventricle: The cavity size was normal. Wall thickness was   normal. Systolic function was normal. The estimated ejection   fraction was in the range of 60% to 65%. Wall motion was normal;   there were no regional wall motion abnormalities. Doppler   parameters are  consistent with abnormal left ventricular   relaxation (grade 1 diastolic dysfunction). - Mitral valve: There was mild regurgitation. - Pulmonary arteries: PA peak pressure: 44 mm Hg (S). - Impressions: The LV outflow trract is very narrow but no   significant LVOT obstruction. No evidence of SAM.   Impressions:   - The LV outflow trract is very narrow but no significant LVOT   obstruction. No evidence of SAM.     03/2021 echo 1. Left ventricular ejection fraction, by estimation, is 65 to 70%. The  left ventricle has normal function. The left ventricle has no regional  wall motion abnormalities. Left ventricular diastolic parameters are  indeterminate. Elevated left atrial  pressure.   2. Right ventricular systolic function  is normal. The right ventricular  size is normal. There is mildly elevated pulmonary artery systolic  pressure.   3. Left atrial size was mildly dilated.   4. The mitral valve is abnormal. Mild mitral valve regurgitation. No  evidence of mitral stenosis.   5. The tricuspid valve is abnormal.   6. The aortic valve is tricuspid. Aortic valve regurgitation is not  visualized. No aortic stenosis is present.   7. IVC is small, suggesting low RA pressure and hypovolemia.            Assessment and Plan  Paroxysmal Afib/acquired thrombophilia -no recent symptoms, continue current meds incliuding eliquis for stroke prevention - EKG shows NSR   2. HTN -at goal, continue current meds  F/u 6 months     Antoine Poche, M.D.

## 2023-05-14 DIAGNOSIS — Z23 Encounter for immunization: Secondary | ICD-10-CM | POA: Diagnosis not present

## 2023-07-02 ENCOUNTER — Other Ambulatory Visit: Payer: Self-pay | Admitting: Cardiology

## 2023-08-20 DIAGNOSIS — Z961 Presence of intraocular lens: Secondary | ICD-10-CM | POA: Diagnosis not present

## 2023-08-20 DIAGNOSIS — H52223 Regular astigmatism, bilateral: Secondary | ICD-10-CM | POA: Diagnosis not present

## 2023-08-20 DIAGNOSIS — H524 Presbyopia: Secondary | ICD-10-CM | POA: Diagnosis not present

## 2023-08-20 DIAGNOSIS — H5203 Hypermetropia, bilateral: Secondary | ICD-10-CM | POA: Diagnosis not present

## 2023-10-09 DIAGNOSIS — I10 Essential (primary) hypertension: Secondary | ICD-10-CM | POA: Diagnosis not present

## 2023-10-09 DIAGNOSIS — Z0001 Encounter for general adult medical examination with abnormal findings: Secondary | ICD-10-CM | POA: Diagnosis not present

## 2023-10-09 DIAGNOSIS — R42 Dizziness and giddiness: Secondary | ICD-10-CM | POA: Diagnosis not present

## 2023-10-09 DIAGNOSIS — D559 Anemia due to enzyme disorder, unspecified: Secondary | ICD-10-CM | POA: Diagnosis not present

## 2023-10-09 DIAGNOSIS — E559 Vitamin D deficiency, unspecified: Secondary | ICD-10-CM | POA: Diagnosis not present

## 2023-10-16 DIAGNOSIS — K580 Irritable bowel syndrome with diarrhea: Secondary | ICD-10-CM | POA: Diagnosis not present

## 2023-10-16 DIAGNOSIS — Z6820 Body mass index (BMI) 20.0-20.9, adult: Secondary | ICD-10-CM | POA: Diagnosis not present

## 2023-10-16 DIAGNOSIS — I10 Essential (primary) hypertension: Secondary | ICD-10-CM | POA: Diagnosis not present

## 2023-10-16 DIAGNOSIS — I482 Chronic atrial fibrillation, unspecified: Secondary | ICD-10-CM | POA: Diagnosis not present

## 2023-10-16 DIAGNOSIS — J301 Allergic rhinitis due to pollen: Secondary | ICD-10-CM | POA: Diagnosis not present

## 2023-11-01 ENCOUNTER — Encounter: Payer: Self-pay | Admitting: *Deleted

## 2023-11-01 ENCOUNTER — Ambulatory Visit: Payer: Medicare Other | Attending: Cardiology | Admitting: Cardiology

## 2023-11-01 ENCOUNTER — Encounter: Payer: Self-pay | Admitting: Cardiology

## 2023-11-01 VITALS — BP 116/58 | HR 60 | Ht 65.0 in | Wt 121.0 lb

## 2023-11-01 DIAGNOSIS — I10 Essential (primary) hypertension: Secondary | ICD-10-CM | POA: Diagnosis not present

## 2023-11-01 DIAGNOSIS — D6869 Other thrombophilia: Secondary | ICD-10-CM

## 2023-11-01 DIAGNOSIS — I480 Paroxysmal atrial fibrillation: Secondary | ICD-10-CM | POA: Diagnosis not present

## 2023-11-01 NOTE — Patient Instructions (Signed)
 Medication Instructions:  Continue all current medications.   Labwork: none  Testing/Procedures: none  Follow-Up: 6 months   Any Other Special Instructions Will Be Listed Below (If Applicable).   If you need a refill on your cardiac medications before your next appointment, please call your pharmacy.

## 2023-11-01 NOTE — Progress Notes (Signed)
 Clinical Summary [name] is a 89 y.o.female seen today for follow up of the following medical problems.    1.PAF - Low dose eliquis  based on age >64 and wt <60kg - no recent palpitations - compliant with meds. No bleeding on eliquis .      2. SOB 03/2021 echo: LVEF 65-70%, no WMAs, indet diastolic, elevated LA pressure, normal RV, mild MR - breathing has improved since last visit. She reports treatment for bronchitis.    - no recent symptoms.        3. HTN   -home bp's 110s-120s/50s - compliant with meds   4. Heart murmur  03/2019 echo LVEF 60-65%, narrow LVOT but no obstruction, mild MR 03/2021 echo LVEF65-70%, no WMAs, no significant valvualr abnormalities   5. LE edema - mild edema, resolves at night - has prn lasix , has not needed. Wears compression stockings at times, typically not on hot days    Past Medical History:  Diagnosis Date   Atrial fibrillation (HCC)    Hypertension    IBS (irritable bowel syndrome)    PONV (postoperative nausea and vomiting)      Allergies  Allergen Reactions   Mercury Other (See Comments)    unknown     Current Outpatient Medications  Medication Sig Dispense Refill   ALPRAZolam  (XANAX ) 0.25 MG tablet Take 0.125 mg by mouth daily as needed for anxiety or sleep.      apixaban  (ELIQUIS ) 2.5 MG TABS tablet Take 1 tablet (2.5 mg total) by mouth 2 (two) times daily. 60 tablet 1   ascorbic acid (VITAMIN C) 500 MG tablet Take 500 mg by mouth daily. Take 1 tablet by mouth daily.     carboxymethylcellulose (REFRESH PLUS) 0.5 % SOLN 1 drop 2 (two) times daily as needed.     Cholecalciferol  1000 units capsule Take 1,000 Units by mouth daily.  (Patient not taking: Reported on 04/26/2023)     citalopram  (CELEXA ) 20 MG tablet Take 20 mg by mouth daily.      clobetasol cream (TEMOVATE) 0.05 % Apply 1 application topically 2 (two) times daily as needed.     diltiazem  (CARDIZEM  CD) 180 MG 24 hr capsule TAKE ONE CAPSULE BY MOUTH DAILY  270 capsule 0   furosemide  (LASIX ) 20 MG tablet Take 20 mg by mouth daily. Take 1 tablet as needed for swelling.     hydrocortisone  (ANUSOL -HC) 2.5 % rectal cream Place 1 application rectally 2 (two) times daily as needed for hemorrhoids.      hyoscyamine  (LEVSIN  SL) 0.125 MG SL tablet PLACE 1 TABLET UNDER TONGUE THREE TIMES DAILY AS NEEDED 90 tablet 2   Lactobacillus-Inulin (CULTURELLE DIGESTIVE HEALTH PO) Take 1 tablet by mouth daily.     loratadine  (CLARITIN ) 10 MG tablet Take 10 mg by mouth daily.     MISC NATURAL PRODUCTS PO Take 1 tablet by mouth daily. Hair Volume     predniSONE  (DELTASONE ) 20 MG tablet 3 tabs po day one, then 2 tabs daily x 4 days (Patient not taking: Reported on 04/26/2023) 11 tablet 0   Propylene Glycol (SYSTANE BALANCE) 0.6 % SOLN Apply 1 drop to eye 2 (two) times a day.     SF 5000 PLUS 1.1 % CREA dental cream USE IN PLACE OF REGULAR TOOTHPASTE, BRUSH BEFORE BEDTIME FOR TWO MINUTES, THEN EXPECTORATE. NO RINSING, EATING OR DRINKING FOR 30 MINUTES.  99   vitamin B-12 (CYANOCOBALAMIN ) 500 MCG tablet Take 1 tablet (500 mcg total) by mouth  daily.     No current facility-administered medications for this visit.     Past Surgical History:  Procedure Laterality Date   CATARACT EXTRACTION W/PHACO Left 02/02/2017   Procedure: CATARACT EXTRACTION PHACO AND INTRAOCULAR LENS PLACEMENT (IOC);  Surgeon: Tarri Farm, MD;  Location: [facility];  Service: Ophthalmology;  Laterality: Left;  CDE: 14.49   CATARACT EXTRACTION W/PHACO Right 03/02/2017   Procedure: CATARACT EXTRACTION PHACO AND INTRAOCULAR LENS PLACEMENT (IOC);  Surgeon: Tarri Farm, MD;  Location: [facility];  Service: Ophthalmology;  Laterality: Right;  CDE: 12.29   COLONOSCOPY N/A 07/24/2014   Procedure: COLONOSCOPY;  Surgeon: Ruby Corporal, MD;  Location: [facility];  Service: Endoscopy;  Laterality: N/A;  900   HEMORRHOID SURGERY     TONSILLECTOMY       Allergies  Allergen Reactions   Mercury Other (See  Comments)    unknown      Family History  Problem Relation Age of Onset   Congestive Heart Failure Mother      Social History [name] reports that she has never smoked. She has never used smokeless tobacco. [name] reports no history of alcohol  use.   Physical Examination Today's Vitals   11/01/23 1054  BP: (!) 116/58  Pulse: 60  SpO2: 97%  Weight: 121 lb (54.9 kg)  Height: 5' 5 (1.651 m)   Body mass index is 20.14 kg/m.  Gen: resting comfortably, no acute distress HEENT: no scleral icterus, pupils equal round and reactive, no palptable cervical adenopathy,  CV: RRR, no m/rg, no jvd Resp: Clear to auscultation bilaterally GI: abdomen is soft, non-tender, non-distended, normal bowel sounds, no hepatosplenomegaly MSK: extremities are warm, no edema.  Skin: warm, no rash Neuro:  no focal deficits Psych: appropriate affect   Diagnostic Studies  Echoardiogram 05/25/2018:   - Left ventricle: The cavity size was normal. Wall thickness was   normal. Systolic function was normal. The estimated ejection   fraction was in the range of 60% to 65%. Wall motion was normal;   there were no regional wall motion abnormalities. Doppler   parameters are consistent with abnormal left ventricular   relaxation (grade 1 diastolic dysfunction). - Mitral valve: There was mild regurgitation. - Pulmonary arteries: PA peak pressure: 44 mm Hg (S). - Impressions: The LV outflow trract is very narrow but no   significant LVOT obstruction. No evidence of SAM.   Impressions:   - The LV outflow trract is very narrow but no significant LVOT   obstruction. No evidence of SAM.     03/2021 echo 1. Left ventricular ejection fraction, by estimation, is 65 to 70%. The  left ventricle has normal function. The left ventricle has no regional  wall motion abnormalities. Left ventricular diastolic parameters are  indeterminate. Elevated left atrial  pressure.   2. Right ventricular systolic  function is normal. The right ventricular  size is normal. There is mildly elevated pulmonary artery systolic  pressure.   3. Left atrial size was mildly dilated.   4. The mitral valve is abnormal. Mild mitral valve regurgitation. No  evidence of mitral stenosis.   5. The tricuspid valve is abnormal.   6. The aortic valve is tricuspid. Aortic valve regurgitation is not  visualized. No aortic stenosis is present.   7. IVC is small, suggesting low RA pressure and hypovolemia.   Assessment and Plan  Paroxysmal Afib/acquired thrombophilia -no symptoms, cotinue current med sincluding eliquis  for stroke prevention EKG shows ectopic atrial rhythm at 60  bpm   2. HTN Bp at goal, continue current meds   Request labs from pcp  Laurann Pollock, M.D.

## 2023-11-02 ENCOUNTER — Encounter: Payer: Self-pay | Admitting: Cardiology

## 2023-12-24 DIAGNOSIS — L570 Actinic keratosis: Secondary | ICD-10-CM | POA: Diagnosis not present

## 2023-12-24 DIAGNOSIS — L821 Other seborrheic keratosis: Secondary | ICD-10-CM | POA: Diagnosis not present

## 2024-01-24 DIAGNOSIS — D649 Anemia, unspecified: Secondary | ICD-10-CM | POA: Diagnosis not present

## 2024-01-24 DIAGNOSIS — M79604 Pain in right leg: Secondary | ICD-10-CM | POA: Diagnosis not present

## 2024-01-24 DIAGNOSIS — D559 Anemia due to enzyme disorder, unspecified: Secondary | ICD-10-CM | POA: Diagnosis not present

## 2024-02-07 DIAGNOSIS — H04123 Dry eye syndrome of bilateral lacrimal glands: Secondary | ICD-10-CM | POA: Diagnosis not present

## 2024-03-04 DIAGNOSIS — Z23 Encounter for immunization: Secondary | ICD-10-CM | POA: Diagnosis not present

## 2024-03-07 DIAGNOSIS — M79604 Pain in right leg: Secondary | ICD-10-CM | POA: Diagnosis not present

## 2024-03-07 DIAGNOSIS — I482 Chronic atrial fibrillation, unspecified: Secondary | ICD-10-CM | POA: Diagnosis not present

## 2024-03-07 DIAGNOSIS — Z6820 Body mass index (BMI) 20.0-20.9, adult: Secondary | ICD-10-CM | POA: Diagnosis not present

## 2024-03-07 DIAGNOSIS — J301 Allergic rhinitis due to pollen: Secondary | ICD-10-CM | POA: Diagnosis not present

## 2024-03-07 DIAGNOSIS — K580 Irritable bowel syndrome with diarrhea: Secondary | ICD-10-CM | POA: Diagnosis not present

## 2024-03-07 DIAGNOSIS — I10 Essential (primary) hypertension: Secondary | ICD-10-CM | POA: Diagnosis not present

## 2024-03-27 ENCOUNTER — Other Ambulatory Visit: Payer: Self-pay | Admitting: Cardiology

## 2024-04-17 ENCOUNTER — Ambulatory Visit: Attending: Cardiology | Admitting: Cardiology

## 2024-04-17 ENCOUNTER — Encounter: Payer: Self-pay | Admitting: Cardiology

## 2024-04-17 VITALS — BP 116/50 | HR 68 | Ht 64.0 in | Wt 122.0 lb

## 2024-04-17 DIAGNOSIS — I480 Paroxysmal atrial fibrillation: Secondary | ICD-10-CM

## 2024-04-17 DIAGNOSIS — D6869 Other thrombophilia: Secondary | ICD-10-CM | POA: Diagnosis not present

## 2024-04-17 DIAGNOSIS — I10 Essential (primary) hypertension: Secondary | ICD-10-CM

## 2024-04-17 NOTE — Patient Instructions (Signed)

## 2024-04-17 NOTE — Progress Notes (Signed)
 Clinical Summary [name] is a 89 y.o.female seen today for follow up of the following medical problems.    1.PAF - Low dose eliquis  based on age >38 and wt <60kg - denies any palpitations - compliant with meds. No bleeding on eliquis .      2. SOB 03/2021 echo: LVEF 65-70%, no WMAs, indet diastolic, elevated LA pressure, normal RV, mild MR - breathing has improved since last visit. She reports treatment for bronchitis.    -no recent issues     3. HTN   -home bp's 110-130/50s to 60s.  - compliant with meds   4. Heart murmur  03/2019 echo LVEF 60-65%, narrow LVOT but no obstruction, mild MR 03/2021 echo LVEF65-70%, no WMAs, no significant valvualr abnormalities   5. LE edema - wearing compression stocks - has lasix  prn, has not needed.  Past Medical History:  Diagnosis Date   Atrial fibrillation (HCC)    Hypertension    IBS (irritable bowel syndrome)    PONV (postoperative nausea and vomiting)      Allergies  Allergen Reactions   Mercury Other (See Comments)    unknown     Current Outpatient Medications  Medication Sig Dispense Refill   ALPRAZolam  (XANAX ) 0.25 MG tablet Take 0.125 mg by mouth daily as needed for anxiety or sleep.      apixaban  (ELIQUIS ) 2.5 MG TABS tablet Take 1 tablet (2.5 mg total) by mouth 2 (two) times daily. 60 tablet 1   ascorbic acid (VITAMIN C) 500 MG tablet Take 500 mg by mouth daily. Take 1 tablet by mouth daily.     carboxymethylcellulose (REFRESH PLUS) 0.5 % SOLN 1 drop 2 (two) times daily as needed.     Cholecalciferol  1000 units capsule Take 1,000 Units by mouth daily.      citalopram  (CELEXA ) 20 MG tablet Take 20 mg by mouth daily.      clobetasol cream (TEMOVATE) 0.05 % Apply 1 application topically 2 (two) times daily as needed.     diltiazem  (CARDIZEM  CD) 180 MG 24 hr capsule TAKE ONE CAPSULE BY MOUTH DAILY 90 capsule 2   furosemide  (LASIX ) 20 MG tablet Take 20 mg by mouth daily. Take 1 tablet as needed for swelling.      hydrocortisone  (ANUSOL -HC) 2.5 % rectal cream Place 1 application rectally 2 (two) times daily as needed for hemorrhoids.      hyoscyamine  (LEVSIN  SL) 0.125 MG SL tablet PLACE 1 TABLET UNDER TONGUE THREE TIMES DAILY AS NEEDED 90 tablet 2   Lactobacillus-Inulin (CULTURELLE DIGESTIVE HEALTH PO) Take 1 tablet by mouth daily.     loratadine  (CLARITIN ) 10 MG tablet Take 10 mg by mouth daily.     MISC NATURAL PRODUCTS PO Take 1 tablet by mouth daily. Hair Volume     predniSONE  (DELTASONE ) 20 MG tablet 3 tabs po day one, then 2 tabs daily x 4 days 11 tablet 0   Propylene Glycol (SYSTANE BALANCE) 0.6 % SOLN Apply 1 drop to eye 2 (two) times a day.     SF 5000 PLUS 1.1 % CREA dental cream USE IN PLACE OF REGULAR TOOTHPASTE, BRUSH BEFORE BEDTIME FOR TWO MINUTES, THEN EXPECTORATE. NO RINSING, EATING OR DRINKING FOR 30 MINUTES.  99   vitamin B-12 (CYANOCOBALAMIN ) 500 MCG tablet Take 1 tablet (500 mcg total) by mouth daily.     No current facility-administered medications for this visit.     Past Surgical History:  Procedure Laterality Date   CATARACT EXTRACTION  W/PHACO Left 02/02/2017   Procedure: CATARACT EXTRACTION PHACO AND INTRAOCULAR LENS PLACEMENT (IOC);  Surgeon: Harrie Agent, MD;  Location: [facility];  Service: Ophthalmology;  Laterality: Left;  CDE: 14.49   CATARACT EXTRACTION W/PHACO Right 03/02/2017   Procedure: CATARACT EXTRACTION PHACO AND INTRAOCULAR LENS PLACEMENT (IOC);  Surgeon: Harrie Agent, MD;  Location: [facility];  Service: Ophthalmology;  Laterality: Right;  CDE: 12.29   COLONOSCOPY N/A 07/24/2014   Procedure: COLONOSCOPY;  Surgeon: Claudis RAYMOND Rivet, MD;  Location: [facility];  Service: Endoscopy;  Laterality: N/A;  900   HEMORRHOID SURGERY     TONSILLECTOMY       Allergies  Allergen Reactions   Mercury Other (See Comments)    unknown      Family History  Problem Relation Age of Onset   Congestive Heart Failure Mother      Social History [name] reports that she  has never smoked. She has never used smokeless tobacco. [name] reports no history of alcohol  use.    Physical Examination Today's Vitals   04/17/24 1138  BP: (!) 116/50  Pulse: 68  SpO2: 97%  Weight: 122 lb (55.3 kg)  Height: 5' 4 (1.626 m)   Body mass index is 20.94 kg/m.  Gen: resting comfortably, no acute distress HEENT: no scleral icterus, pupils equal round and reactive, no palptable cervical adenopathy,  CV: RRR, 2/6 systolic murmur rusb, no jvd Resp: Clear to auscultation bilaterally GI: abdomen is soft, non-tender, non-distended, normal bowel sounds, no hepatosplenomegaly MSK: extremities are warm, no edema.  Skin: warm, no rash Neuro:  no focal deficits Psych: appropriate affect   Diagnostic Studies  Echoardiogram 05/25/2018:   - Left ventricle: The cavity size was normal. Wall thickness was   normal. Systolic function was normal. The estimated ejection   fraction was in the range of 60% to 65%. Wall motion was normal;   there were no regional wall motion abnormalities. Doppler   parameters are consistent with abnormal left ventricular   relaxation (grade 1 diastolic dysfunction). - Mitral valve: There was mild regurgitation. - Pulmonary arteries: PA peak pressure: 44 mm Hg (S). - Impressions: The LV outflow trract is very narrow but no   significant LVOT obstruction. No evidence of SAM.   Impressions:   - The LV outflow trract is very narrow but no significant LVOT   obstruction. No evidence of SAM.     03/2021 echo 1. Left ventricular ejection fraction, by estimation, is 65 to 70%. The  left ventricle has normal function. The left ventricle has no regional  wall motion abnormalities. Left ventricular diastolic parameters are  indeterminate. Elevated left atrial  pressure.   2. Right ventricular systolic function is normal. The right ventricular  size is normal. There is mildly elevated pulmonary artery systolic  pressure.   3. Left atrial size  was mildly dilated.   4. The mitral valve is abnormal. Mild mitral valve regurgitation. No  evidence of mitral stenosis.   5. The tricuspid valve is abnormal.   6. The aortic valve is tricuspid. Aortic valve regurgitation is not  visualized. No aortic stenosis is present.   7. IVC is small, suggesting low RA pressure and hypovolemia.     Assessment and Plan   Paroxysmal Afib/acquired thrombophilia -denies any symptoms, continue current meds including eliquis  for stroke prevention   2. HTN - bp remains at goal, continue current therapy     Dorn PHEBE Ross, M.D.

## 2024-10-20 ENCOUNTER — Ambulatory Visit: Admitting: Cardiology
